# Patient Record
Sex: Female | Born: 1995 | Race: White | Hispanic: No | Marital: Single | Smoking: Never smoker
Health system: Southern US, Community
[De-identification: ages and names within clinical notes are randomized; demographics above are authoritative.]

## PROBLEM LIST (undated history)

## (undated) HISTORY — PX: UMBILICAL HERNIA REPAIR: SHX196

---
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
## 2016-06-17 ENCOUNTER — Encounter: Payer: Self-pay | Admitting: Internal Medicine

## 2016-07-01 ENCOUNTER — Encounter: Payer: Self-pay | Admitting: Internal Medicine

## 2016-07-01 ENCOUNTER — Ambulatory Visit (INDEPENDENT_AMBULATORY_CARE_PROVIDER_SITE_OTHER): Payer: 59 | Admitting: Internal Medicine

## 2016-07-01 VITALS — BP 138/84 | HR 66 | Temp 98.0°F | Resp 18 | Ht 65.0 in | Wt 315.0 lb

## 2016-07-01 DIAGNOSIS — Z130 Encounter for screening for diseases of the blood and blood-forming organs and certain disorders involving the immune mechanism: Secondary | ICD-10-CM

## 2016-07-01 DIAGNOSIS — Z1389 Encounter for screening for other disorder: Secondary | ICD-10-CM

## 2016-07-01 DIAGNOSIS — Z0000 Encounter for general adult medical examination without abnormal findings: Secondary | ICD-10-CM | POA: Diagnosis not present

## 2016-07-01 DIAGNOSIS — Z1329 Encounter for screening for other suspected endocrine disorder: Secondary | ICD-10-CM

## 2016-07-01 DIAGNOSIS — Z79899 Other long term (current) drug therapy: Secondary | ICD-10-CM

## 2016-07-01 DIAGNOSIS — E559 Vitamin D deficiency, unspecified: Secondary | ICD-10-CM

## 2016-07-01 DIAGNOSIS — Z13220 Encounter for screening for lipoid disorders: Secondary | ICD-10-CM

## 2016-07-01 DIAGNOSIS — Z131 Encounter for screening for diabetes mellitus: Secondary | ICD-10-CM

## 2016-07-01 DIAGNOSIS — Z0001 Encounter for general adult medical examination with abnormal findings: Secondary | ICD-10-CM

## 2016-07-01 DIAGNOSIS — R197 Diarrhea, unspecified: Secondary | ICD-10-CM

## 2016-07-01 DIAGNOSIS — Z3009 Encounter for other general counseling and advice on contraception: Secondary | ICD-10-CM

## 2016-07-01 MED ORDER — NORETHINDRONE ACET-ETHINYL EST 1.5-30 MG-MCG PO TABS: 1.0000 | ORAL_TABLET | Freq: Every day | ORAL | 11 refills | Status: DC

## 2016-07-01 MED ORDER — HYOSCYAMINE SULFATE 0.125 MG SL SUBL: SUBLINGUAL_TABLET | SUBLINGUAL | 0 refills | Status: DC

## 2016-07-01 NOTE — Progress Notes (Signed)
Annual Screening Comprehensive Examination   This very nice [age] y.o.female presents for complete physical.  Patient has no major health issues.  Patient reports no complaints at this time.   She is a Consulting civil engineerstudent at [employer].  She is studying Corporate investment bankerChristian Ministry.  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 She reports that she is getting good grades for the most part.  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  She reports that she is having to work on getting people to get along.    She reports that her period has been quite irregular.  She reports that it is really heavy and it can sometimes be very irregular.    She does have periods where for a couple weeks she will eat and then 15-20 minutes later she develops diarrhea.  She reports that it comes and goes.  She reports that this has been going on intermittently for a couple years.  She reports that there is no history of irritable bowel syndrome. She has found that ice cream triggers it.  She reports that she is not sure whether those happen under heavier stress levels.    Finally, patient has history of Vitamin D Deficiency and last vitamin D was No results found for: VD25OH.  Currently on supplementation     No current outpatient prescriptions on file prior to visit.   No current facility-administered medications on file prior to visit.     No Known Allergies  No past medical history on file.   There is no immunization history on file for this patient.  No past surgical history on file.  No family history on file.  Social History   Social History  . Marital status: Single    Spouse name: N/A  . Number of children: N/A  . Years of education: N/A   Occupational History  . Not on file.   Social History Main Topics  . Smoking status: Never Smoker  . Smokeless tobacco: Never Used  . Alcohol use No  . Drug use: No  . Sexual activity: Not on file   Other Topics Concern  . Not on file   Social History Narrative  . No  narrative on file   Review of Systems  Constitutional: Negative for chills, fever and malaise/fatigue.  HENT: Negative for congestion, ear pain, hearing loss and sore throat.   Eyes: Negative.   Respiratory: Negative for cough, shortness of breath, wheezing and stridor.   Cardiovascular: Negative for chest pain, palpitations and leg swelling.  Gastrointestinal: Positive for diarrhea. Negative for abdominal pain, blood in stool, constipation, heartburn, melena, nausea and vomiting.  Genitourinary: Negative.   Neurological: Negative for sensory change and loss of consciousness.  Psychiatric/Behavioral: Negative for depression. The patient is not nervous/anxious and does not have insomnia.       Physical Exam  BP 138/84   Pulse 66   Temp 98 F (36.7 C) (Temporal)   Resp 18   Ht 5\' 5"  (1.651 m)   Wt (!) 315 lb (142.9 kg)   LMP 07/01/2016   SpO2 98%   BMI 52.42 kg/m   General Appearance: Morbidly obese WF, Well nourished and in no apparent distress. Eyes: PERRLA, EOMs, conjunctiva no swelling or erythema, normal fundi and vessels. Sinuses: No frontal/maxillary tenderness ENT/Mouth: EACs patent / TMs  nl. Nares clear without erythema, swelling, mucoid exudates. Oral hygiene is good. No erythema, swelling, or exudate. Tongue normal, non-obstructing. Tonsils not swollen or erythematous. Hearing normal.  Neck:  Supple, thyroid normal. No bruits, nodes or JVD. Respiratory: Respiratory effort normal.  BS equal and clear bilateral without rales, rhonci, wheezing or stridor. Cardio: Heart sounds are normal with regular rate and rhythm and no murmurs, rubs or gallops. Peripheral pulses are normal and equal bilaterally without edema. No aortic or femoral bruits. Chest: symmetric with normal excursions and percussion. Abdomen: Flat, soft, with bowl sounds. Nontender, no guarding, rebound, hernias, masses, or organomegaly.  Lymphatics: Non tender without lymphadenopathy.  Musculoskeletal: Full  ROM all peripheral extremities, joint stability, 5/5 strength, and normal gait. Skin: Warm and dry without rashes, lesions, cyanosis, clubbing or  ecchymosis.  Neuro: Cranial nerves intact, reflexes equal bilaterally. Normal muscle tone, no cerebellar symptoms. Sensation intact.  Pysch: Awake and oriented X 3, normal affect, Insight and Judgment appropriate.   Assessment and Plan   1. Encounter for general adult medical examination with abnormal findings -due next year  2. Screening for diabetes mellitus  - Hemoglobin A1c - Insulin, random  3. Screening for hyperlipidemia  - Lipid panel  4. Screening for deficiency anemia  - Iron and TIBC - Vitamin B12  5. Screening for hematuria or proteinuria  - Urinalysis, Routine w reflex microscopic - Microalbumin / creatinine urine ratio  6. Vitamin D deficiency -likely needs to start Vitamin D - VITAMIN D 25 Hydroxy (Vit-D Deficiency, Fractures)  7. Screening for thyroid disorder  - TSH  8. Diarrhea, unspecified type -Likely IBS -try hyoscamine - Celiac panel 10 - Food Allergy Panel  9. Medication management  - CBC with Differential/Platelet - BASIC METABOLIC PANEL WITH GFR - Hepatic function panel - Magnesium  10. Morbid obesity (HCC) -discussed diet and exercise -pateint admits she is an emotional eater  11.  New start birth control -loloestrin -patient to call if she needs anything      Continue prudent diet as discussed, weight control, regular exercise, and medications. Routine screening labs and tests as requested with regular follow-up as recommended.  Over 40 minutes of exam, counseling, chart review and critical decision making was performed

## 2016-07-01 NOTE — Patient Instructions (Signed)
Ethinyl Estradiol; Norethindrone Acetate tablets (contraception) What is this medicine? ETHINYL ESTRADIOL; NORETHINDRONE ACETATE (ETH in il es tra DYE ole; nor eth IN drone AS e tate) is an oral contraceptive. The products combine two types of female hormones, an estrogen and a progestin. They are used to prevent ovulation and pregnancy. This medicine may be used for other purposes; ask your health care provider or pharmacist if you have questions. COMMON BRAND NAME(S): Gildess, Junel 1.5/30, Junel 1/20, LARIN, Loestrin 1.5/30, Loestrin 1/20, Microgestin 1.5/30, Microgestin 1/20 What should I tell my health care provider before I take this medicine? They need to know if you have or ever had any of these conditions: -abnormal vaginal bleeding -blood vessel disease or blood clots -breast, cervical, endometrial, ovarian, liver, or uterine cancer -diabetes -gallbladder disease -heart disease or recent heart attack -high blood pressure -high cholesterol -kidney disease -liver disease -migraine headaches -stroke -systemic lupus erythematosus (SLE) -tobacco smoker -an unusual or allergic reaction to estrogens, progestins, other medicines, foods, dyes, or preservatives -pregnant or trying to get pregnant -breast-feeding How should I use this medicine? Take this medicine by mouth. To reduce nausea, this medicine may be taken with food. Follow the directions on the prescription label. Take this medicine at the same time each day and in the order directed on the package. Do not take your medicine more often than directed. Contact your pediatrician regarding the use of this medicine in children. Special care may be needed. This medicine has been used in female children who have started having menstrual periods. A patient package insert for the product will be given with each prescription and refill. Read this sheet carefully each time. The sheet may change frequently. Overdosage: If you think you  have taken too much of this medicine contact a poison control center or emergency room at once. NOTE: This medicine is only for you. Do not share this medicine with others. What if I miss a dose? If you miss a dose, refer to the patient information sheet you received with your medicine for direction. If you miss more than one pill, this medicine may not be as effective and you may need to use another form of birth control. What may interact with this medicine? Do not take this medicine with the following medication: -dasabuvir; ombitasvir; paritaprevir; ritonavir -ombitasvir; paritaprevir; ritonavir This medicine may also interact with the following medications: -acetaminophen -antibiotics or medicines for infections, especially rifampin, rifabutin, rifapentine, and griseofulvin, and possibly penicillins or tetracyclines -aprepitant -ascorbic acid (vitamin C) -atorvastatin -barbiturate medicines, such as phenobarbital -bosentan -carbamazepine -caffeine -clofibrate -cyclosporine -dantrolene -doxercalciferol -felbamate -grapefruit juice -hydrocortisone -medicines for anxiety or sleeping problems, such as diazepam or temazepam -medicines for diabetes, including pioglitazone -mineral oil -modafinil -mycophenolate -nefazodone -oxcarbazepine -phenytoin -prednisolone -ritonavir or other medicines for HIV infection or AIDS -rosuvastatin -selegiline -soy isoflavones supplements -St. John's wort -tamoxifen or raloxifene -theophylline -thyroid hormones -topiramate -warfarin This list may not describe all possible interactions. Give your health care provider a list of all the medicines, herbs, non-prescription drugs, or dietary supplements you use. Also tell them if you smoke, drink alcohol, or use illegal drugs. Some items may interact with your medicine. What should I watch for while using this medicine? Visit your doctor or health care professional for regular checks on your  progress. You will need a regular breast and pelvic exam and Pap smear while on this medicine. Use an additional method of contraception during the first cycle that you take these tablets. If you have any   reason to think you are pregnant, stop taking this medicine right away and contact your doctor or health care professional. If you are taking this medicine for hormone related problems, it may take several cycles of use to see improvement in your condition. Smoking increases the risk of getting a blood clot or having a stroke while you are taking birth control pills, especially if you are more than 22 years old. You are strongly advised not to smoke. This medicine can make your body retain fluid, making your fingers, hands, or ankles swell. Your blood pressure can go up. Contact your doctor or health care professional if you feel you are retaining fluid. This medicine can make you more sensitive to the sun. Keep out of the sun. If you cannot avoid being in the sun, wear protective clothing and use sunscreen. Do not use sun lamps or tanning beds/booths. If you wear contact lenses and notice visual changes, or if the lenses begin to feel uncomfortable, consult your eye care specialist. In some women, tenderness, swelling, or minor bleeding of the gums may occur. Notify your dentist if this happens. Brushing and flossing your teeth regularly may help limit this. See your dentist regularly and inform your dentist of the medicines you are taking. If you are going to have elective surgery, you may need to stop taking this medicine before the surgery. Consult your health care professional for advice. This medicine does not protect you against HIV infection (AIDS) or any other sexually transmitted diseases. What side effects may I notice from receiving this medicine? Side effects that you should report to your doctor or health care professional as soon as possible: -breast tissue changes or discharge -changes  in vaginal bleeding during your period or between your periods -chest pain -coughing up blood -dizziness or fainting spells -headaches or migraines -leg, arm or groin pain -severe or sudden headaches -stomach pain (severe) -sudden shortness of breath -sudden loss of coordination, especially on one side of the body -speech problems -symptoms of vaginal infection like itching, irritation or unusual discharge -tenderness in the upper abdomen -vomiting -weakness or numbness in the arms or legs, especially on one side of the body -yellowing of the eyes or skin Side effects that usually do not require medical attention (report to your doctor or health care professional if they continue or are bothersome): -breakthrough bleeding and spotting that continues beyond the 3 initial cycles of pills -breast tenderness -mood changes, anxiety, depression, frustration, anger, or emotional outbursts -increased sensitivity to sun or ultraviolet light -nausea -skin rash, acne, or brown spots on the skin -weight gain (slight) This list may not describe all possible side effects. Call your doctor for medical advice about side effects. You may report side effects to FDA at 1-800-FDA-1088. Where should I keep my medicine? Keep out of the reach of children. Store at room temperature between 15 and 30 degrees C (59 and 86 degrees F). Throw away any unused medicine after the expiration date. NOTE: This sheet is a summary. It may not cover all possible information. If you have questions about this medicine, talk to your doctor, pharmacist, or health care provider.  2018 Elsevier/Gold Standard (2015-12-25 08:02:50)  

## 2016-07-02 ENCOUNTER — Encounter: Payer: Self-pay | Admitting: Internal Medicine

## 2016-07-02 LAB — IRON AND TIBC
%SAT: 16 % (ref 11–50)
IRON: 59 ug/dL (ref 40–190)
TIBC: 369 ug/dL (ref 250–450)
UIBC: 310 ug/dL (ref 125–400)

## 2016-07-02 LAB — CBC WITH DIFFERENTIAL/PLATELET
BASOS PCT: 1 %
Basophils Absolute: 81 cells/uL (ref 0–200)
Eosinophils Absolute: 81 cells/uL (ref 15–500)
Eosinophils Relative: 1 %
HEMATOCRIT: 42.6 % (ref 35.0–45.0)
Hemoglobin: 13.7 g/dL (ref 11.7–15.5)
LYMPHS PCT: 30 %
Lymphs Abs: 2430 cells/uL (ref 850–3900)
MCH: 29.4 pg (ref 27.0–33.0)
MCHC: 32.2 g/dL (ref 32.0–36.0)
MCV: 91.4 fL (ref 80.0–100.0)
MONOS PCT: 4 %
MPV: 9.7 fL (ref 7.5–12.5)
Monocytes Absolute: 324 cells/uL (ref 200–950)
NEUTROS PCT: 64 %
Neutro Abs: 5184 cells/uL (ref 1500–7800)
PLATELETS: 408 10*3/uL — AB (ref 140–400)
RBC: 4.66 MIL/uL (ref 3.80–5.10)
RDW: 13.5 % (ref 11.0–15.0)
WBC: 8.1 10*3/uL (ref 3.8–10.8)

## 2016-07-02 LAB — HEMOGLOBIN A1C
Hgb A1c MFr Bld: 5.4 % (ref <5.7)
Mean Plasma Glucose: 108 mg/dL

## 2016-07-02 LAB — HEPATIC FUNCTION PANEL
ALBUMIN: 4.4 g/dL (ref 3.6–5.1)
ALK PHOS: 62 U/L (ref 33–115)
ALT: 17 U/L (ref 6–29)
AST: 16 U/L (ref 10–30)
BILIRUBIN TOTAL: 0.3 mg/dL (ref 0.2–1.2)
Bilirubin, Direct: 0 mg/dL (ref <=0.2)
Indirect Bilirubin: 0.3 mg/dL (ref 0.2–1.2)
TOTAL PROTEIN: 7.5 g/dL (ref 6.1–8.1)

## 2016-07-02 LAB — IGA: IgA: 116 mg/dL (ref 81–463)

## 2016-07-02 LAB — URINALYSIS, MICROSCOPIC ONLY
BACTERIA UA: NONE SEEN [HPF]
Casts: NONE SEEN [LPF]
Crystals: NONE SEEN [HPF]
Yeast: NONE SEEN [HPF]

## 2016-07-02 LAB — LIPID PANEL
CHOLESTEROL: 243 mg/dL — AB (ref <200)
HDL: 43 mg/dL — AB (ref >50)
LDL Cholesterol: 166 mg/dL — ABNORMAL HIGH (ref <100)
TRIGLYCERIDES: 168 mg/dL — AB (ref <150)
Total CHOL/HDL Ratio: 5.7 Ratio — ABNORMAL HIGH (ref <5.0)
VLDL: 34 mg/dL — ABNORMAL HIGH (ref <30)

## 2016-07-02 LAB — URINALYSIS, ROUTINE W REFLEX MICROSCOPIC
BILIRUBIN URINE: NEGATIVE
GLUCOSE, UA: NEGATIVE
KETONES UR: NEGATIVE
Nitrite: NEGATIVE
PH: 6 (ref 5.0–8.0)
Specific Gravity, Urine: 1.013 (ref 1.001–1.035)

## 2016-07-02 LAB — MAGNESIUM: Magnesium: 2.1 mg/dL (ref 1.5–2.5)

## 2016-07-02 LAB — BASIC METABOLIC PANEL WITH GFR
BUN: 10 mg/dL (ref 7–25)
CALCIUM: 9.7 mg/dL (ref 8.6–10.2)
CO2: 27 mmol/L (ref 20–31)
Chloride: 103 mmol/L (ref 98–110)
Creat: 0.63 mg/dL (ref 0.50–1.10)
GLUCOSE: 80 mg/dL (ref 65–99)
POTASSIUM: 4.4 mmol/L (ref 3.5–5.3)
Sodium: 138 mmol/L (ref 135–146)

## 2016-07-02 LAB — VITAMIN B12: VITAMIN B 12: 350 pg/mL (ref 200–1100)

## 2016-07-02 LAB — FOOD ALLERGY PANEL
Clams: <0.1 kU/L
Corn: <0.1 kU/L
Fish Cod: <0.1 kU/L
Peanut IgE: <0.1 kU/L
Soybean IgE: <0.1 kU/L

## 2016-07-02 LAB — TSH: TSH: 3.12 mIU/L

## 2016-07-02 LAB — MICROALBUMIN / CREATININE URINE RATIO
Creatinine, Urine: 74 mg/dL (ref 20–320)
MICROALB/CREAT RATIO: 59 ug/mg{creat} — AB (ref <30)
Microalb, Ur: 4.4 mg/dL

## 2016-07-02 LAB — VITAMIN D 25 HYDROXY (VIT D DEFICIENCY, FRACTURES): Vit D, 25-Hydroxy: 19 ng/mL — ABNORMAL LOW (ref 30–100)

## 2016-07-02 LAB — INSULIN, RANDOM: INSULIN: 12.2 u[IU]/mL (ref 2.0–19.6)

## 2016-07-02 LAB — ENDOMYSIAL AB IGA RFLX TITER: ENDOMYSIAL SCREEN: NEGATIVE

## 2016-07-04 LAB — GLIADIN ANTIBODIES, SERUM
GLIADIN IGA: 6 U (ref <20)
Gliadin IgG: 2 Units (ref <20)

## 2016-07-04 LAB — TISSUE TRANSGLUTAMINASE, IGG: TISSUE TRANSGLUT AB: 2 U/mL (ref <6)

## 2016-07-04 LAB — TISSUE TRANSGLUTAMINASE, IGA: TISSUE TRANSGLUTAMINASE AB, IGA: 1 U/mL (ref <4)

## 2016-07-07 ENCOUNTER — Encounter: Payer: Self-pay | Admitting: Internal Medicine

## 2016-07-22 ENCOUNTER — Encounter: Payer: Self-pay | Admitting: Internal Medicine

## 2016-08-19 ENCOUNTER — Encounter: Payer: Self-pay | Admitting: Physician Assistant

## 2016-08-19 ENCOUNTER — Ambulatory Visit (INDEPENDENT_AMBULATORY_CARE_PROVIDER_SITE_OTHER): Payer: 59 | Admitting: Physician Assistant

## 2016-08-19 ENCOUNTER — Ambulatory Visit: Payer: 59

## 2016-08-19 VITALS — BP 118/74 | HR 76 | Temp 97.3°F | Resp 16 | Ht 65.0 in | Wt 306.0 lb

## 2016-08-19 DIAGNOSIS — R0789 Other chest pain: Secondary | ICD-10-CM | POA: Diagnosis not present

## 2016-08-19 DIAGNOSIS — G44209 Tension-type headache, unspecified, not intractable: Secondary | ICD-10-CM | POA: Diagnosis not present

## 2016-08-19 DIAGNOSIS — Z23 Encounter for immunization: Secondary | ICD-10-CM

## 2016-08-19 DIAGNOSIS — R42 Dizziness and giddiness: Secondary | ICD-10-CM | POA: Diagnosis not present

## 2016-08-19 LAB — HEPATIC FUNCTION PANEL
ALK PHOS: 40 U/L (ref 33–115)
ALT: 10 U/L (ref 6–29)
AST: 16 U/L (ref 10–30)
Albumin: 4 g/dL (ref 3.6–5.1)
Bilirubin, Direct: 0 mg/dL (ref <=0.2)
Indirect Bilirubin: 0.2 mg/dL (ref 0.2–1.2)
TOTAL PROTEIN: 6.8 g/dL (ref 6.1–8.1)
Total Bilirubin: 0.2 mg/dL (ref 0.2–1.2)

## 2016-08-19 LAB — BASIC METABOLIC PANEL WITH GFR
BUN: 9 mg/dL (ref 7–25)
CHLORIDE: 106 mmol/L (ref 98–110)
CO2: 20 mmol/L (ref 20–31)
CREATININE: 0.67 mg/dL (ref 0.50–1.10)
Calcium: 9.4 mg/dL (ref 8.6–10.2)
GFR, Est African American: >89 mL/min (ref >=60)
GFR, Est Non African American: >89 mL/min (ref >=60)
GLUCOSE: 82 mg/dL (ref 65–99)
POTASSIUM: 4.6 mmol/L (ref 3.5–5.3)
Sodium: 139 mmol/L (ref 135–146)

## 2016-08-19 LAB — CBC WITH DIFFERENTIAL/PLATELET
BASOS ABS: 61 {cells}/uL (ref 0–200)
BASOS PCT: 1 %
EOS ABS: 61 {cells}/uL (ref 15–500)
Eosinophils Relative: 1 %
HEMATOCRIT: 39.9 % (ref 35.0–45.0)
Hemoglobin: 13 g/dL (ref 11.7–15.5)
LYMPHS PCT: 39 %
Lymphs Abs: 2379 cells/uL (ref 850–3900)
MCH: 29.1 pg (ref 27.0–33.0)
MCHC: 32.6 g/dL (ref 32.0–36.0)
MCV: 89.5 fL (ref 80.0–100.0)
MONO ABS: 244 {cells}/uL (ref 200–950)
MPV: 9.8 fL (ref 7.5–12.5)
Monocytes Relative: 4 %
Neutro Abs: 3355 cells/uL (ref 1500–7800)
Neutrophils Relative %: 55 %
Platelets: 361 10*3/uL (ref 140–400)
RBC: 4.46 MIL/uL (ref 3.80–5.10)
RDW: 13.5 % (ref 11.0–15.0)
WBC: 6.1 10*3/uL (ref 3.8–10.8)

## 2016-08-19 LAB — TSH: TSH: 2.43 mIU/L

## 2016-08-19 MED ORDER — PREDNISONE 20 MG PO TABS: ORAL_TABLET | ORAL | 0 refills | Status: DC

## 2016-08-19 NOTE — Patient Instructions (Addendum)
What is the TMJ? The temporomandibular (tem-PUH-ro-man-DIB-yoo-ler) joint, or the TMJ, connects the upper and lower jawbones. This joint allows the jaw to open wide and move back and forth when you chew, talk, or yawn.There are also several muscles that help this joint move. There can be muscle tightness and pain in the muscle that can cause several symptoms.  What causes TMJ pain? There are many causes of TMJ pain. Repeated chewing (for example, chewing gum) and clenching your teeth can cause pain in the joint. Some TMJ pain has no obvious cause. What can I do to ease the pain? There are many things you can do to help your pain get better. When you have pain:  Eat soft foods and stay away from chewy foods (for example, taffy) Try to use both sides of your mouth to chew Don't chew gum Massage Don't open your mouth wide (for example, during yawning or singing) Don't bite your cheeks or fingernails Lower your amount of stress and worry Applying a warm, damp washcloth to the joint may help. Over-the-counter pain medicines such as ibuprofen (one brand: Advil) or acetaminophen (one brand: Tylenol) might also help. Do not use these medicines if you are allergic to them or if your doctor told you not to use them. How can I stop the pain from coming back? When your pain is better, you can do these exercises to make your muscles stronger and to keep the pain from coming back:  Resisted mouth opening: Place your thumb or two fingers under your chin and open your mouth slowly, pushing up lightly on your chin with your thumb. Hold for three to six seconds. Close your mouth slowly. Resisted mouth closing: Place your thumbs under your chin and your two index fingers on the ridge between your mouth and the bottom of your chin. Push down lightly on your chin as you close your mouth. Tongue up: Slowly open and close your mouth while keeping the tongue touching the roof of the mouth. Side-to-side jaw movement:  Place an object about one fourth of an inch thick (for example, two tongue depressors) between your front teeth. Slowly move your jaw from side to side. Increase the thickness of the object as the exercise becomes easier Forward jaw movement: Place an object about one fourth of an inch thick between your front teeth and move the bottom jaw forward so that the bottom teeth are in front of the top teeth. Increase the thickness of the object as the exercise becomes easier. These exercises should not be painful. If it hurts to do these exercises, stop doing them and talk to your family doctor.    Dizziness Dizziness is a common problem. It is a feeling of unsteadiness or light-headedness. You may feel like you are about to faint. Dizziness can lead to injury if you stumble or fall. Anyone can become dizzy, but dizziness is more common in older adults. This condition can be caused by a number of things, including medicines, dehydration, or illness. Follow these instructions at home: Taking these steps may help with your condition: Eating and drinking   Drink enough fluid to keep your urine clear or pale yellow. This helps to keep you from becoming dehydrated. Try to drink more clear fluids, such as water.  Do not drink alcohol.  Limit your caffeine intake if directed by your health care provider.  Limit your salt intake if directed by your health care provider. Activity   Avoid making quick movements.  Rise  slowly from chairs and steady yourself until you feel okay.  In the morning, first sit up on the side of the bed. When you feel okay, stand slowly while you hold onto something until you know that your balance is fine.  Move your legs often if you need to stand in one place for a long time. Tighten and relax your muscles in your legs while you are standing.  Do not drive or operate heavy machinery if you feel dizzy.  Avoid bending down if you feel dizzy. Place items in your home so that  they are easy for you to reach without leaning over. Lifestyle   Do not use any tobacco products, including cigarettes, chewing tobacco, or electronic cigarettes. If you need help quitting, ask your health care provider.  Try to reduce your stress level, such as with yoga or meditation. Talk with your health care provider if you need help. General instructions   Watch your dizziness for any changes.  Take medicines only as directed by your health care provider. Talk with your health care provider if you think that your dizziness is caused by a medicine that you are taking.  Tell a friend or a family member that you are feeling dizzy. If he or she notices any changes in your behavior, have this person call your health care provider.  Keep all follow-up visits as directed by your health care provider. This is important. Contact a health care provider if:  Your dizziness does not go away.  Your dizziness or light-headedness gets worse.  You feel nauseous.  You have reduced hearing.  You have new symptoms.  You are unsteady on your feet or you feel like the room is spinning. Get help right away if:  You vomit or have diarrhea and are unable to eat or drink anything.  You have problems talking, walking, swallowing, or using your arms, hands, or legs.  You feel generally weak.  You are not thinking clearly or you have trouble forming sentences. It may take a friend or family member to notice this.  You have chest pain, abdominal pain, shortness of breath, or sweating.  Your vision changes.  You notice any bleeding.  You have a headache.  You have neck pain or a stiff neck.  You have a fever. This information is not intended to replace advice given to you by your health care provider. Make sure you discuss any questions you have with your health care provider. Document Released: 10/09/2000 Document Revised: 09/21/2015 Document Reviewed: 04/11/2014 Elsevier Interactive  Patient Education  2017 [employer].

## 2016-08-19 NOTE — Progress Notes (Signed)
Subjective:    Patient ID: Yvette Phelps, female    DOB: 1995/07/17, 21 y.o.   MRN: [id number]  HPI 21 y.o. obese WF presents with dizziness, HA behind left eye, chest tightness several occasions. Works at hotel, was working wedding reception, had mild HA with chest tightness, with some nausea and vertigo worse with lying down, movement, no SOB, palpitations, CP. Slight decrease in appetite.    Has history of LOS x 2 one 4 years ago, has been on BCP x 2 months.  BS 86 this AM per mom.   BMI is Body mass index is 50.92 kg/m., she is working on diet and exercise. Wt Readings from Last 3 Encounters:  08/19/16 (!) 306 lb (138.8 kg)  07/01/16 (!) 315 lb (142.9 kg)  11/15/12 250 lb (113.4 kg) (>99 %, Z= 2.45)*   * Growth percentiles are based on CDC 2-20 Years data.   Medications Current Outpatient Prescriptions on File Prior to Visit  Medication Sig  . hyoscyamine (LEVSIN SL) 0.125 MG SL tablet Take 1 to 2 tablets 3 to 4 x day if needed for Nausea, vomiting, cramping or diarrhea  . Norethindrone Acetate-Ethinyl Estradiol (LOESTRIN 1.5/30, 21,) 1.5-30 MG-MCG tablet Take 1 tablet by mouth daily.   No current facility-administered medications on file prior to visit.     Problem list She  does not have a problem list on file.    Review of Systems  Constitutional: Positive for appetite change (decrease). Negative for chills and fever.  HENT: Negative for hearing loss.   Respiratory: Positive for chest tightness. Negative for shortness of breath and wheezing.   Cardiovascular: Negative.  Negative for chest pain, palpitations and leg swelling.  Gastrointestinal: Negative.   Genitourinary: Negative.   Musculoskeletal: Negative.   Neurological: Positive for dizziness and headaches. Negative for tremors, seizures, syncope, facial asymmetry, speech difficulty, weakness, light-headedness and numbness.  Psychiatric/Behavioral: Negative.        Objective:   Physical Exam   Constitutional: She is oriented to person, place, and time. She appears well-developed and well-nourished.  HENT:  Head: Normocephalic and atraumatic.  Right Ear: Hearing and external ear normal. No mastoid tenderness. Tympanic membrane is not injected, not erythematous, not retracted and not bulging. A middle ear effusion is present.  Left Ear: Hearing and external ear normal. No mastoid tenderness. Tympanic membrane is not injected, not erythematous, not retracted and not bulging. A middle ear effusion is present.  + TMJ left worse than right, no abscess appreciated  Eyes: Conjunctivae are normal. Pupils are equal, round, and reactive to light.  Neck: Normal range of motion. Neck supple.  Cardiovascular: Normal rate.   Pulmonary/Chest: Effort normal and breath sounds normal.  Musculoskeletal: Normal range of motion.  Neurological: She is alert and oriented to person, place, and time.  Skin: Skin is warm and dry.       Assessment & Plan:   Chest tightness, HA, vertigo Normal neuro, no red flags ? TMJ, discussed TMJ - D-dimer, quantitative (not at [facility]) - EKG 12-Lead - prednisone Check labs - CBC with Differential/Platelet - BASIC METABOLIC PANEL WITH GFR - Hepatic function panel - TSH  Need for prophylactic vaccination against diphtheria and tetanus TDAP need  .The patient was advised to call immediately if she has any concerning symptoms in the interval. The patient voices understanding of current treatment options and is in agreement with the current care plan.The patient knows to call the clinic with any problems, questions or concerns or  go to the ER if any further progression of symptoms.

## 2016-08-20 LAB — D-DIMER, QUANTITATIVE (NOT AT ARMC): D DIMER QUANT: 0.21 ug{FEU}/mL (ref <0.50)

## 2016-09-11 ENCOUNTER — Encounter: Payer: Self-pay | Admitting: *Deleted

## 2016-12-16 ENCOUNTER — Encounter: Payer: Self-pay | Admitting: Physician Assistant

## 2016-12-16 ENCOUNTER — Ambulatory Visit (INDEPENDENT_AMBULATORY_CARE_PROVIDER_SITE_OTHER): Payer: 59 | Admitting: Physician Assistant

## 2016-12-16 VITALS — BP 120/76 | HR 84 | Temp 97.3°F | Resp 16 | Ht 65.0 in | Wt 289.4 lb

## 2016-12-16 DIAGNOSIS — R21 Rash and other nonspecific skin eruption: Secondary | ICD-10-CM

## 2016-12-16 MED ORDER — PREDNISONE 20 MG PO TABS: ORAL_TABLET | ORAL | 0 refills | Status: DC

## 2016-12-16 MED ORDER — CLOTRIMAZOLE-BETAMETHASONE 1-0.05 % EX CREA: 1.0000 "application " | TOPICAL_CREAM | Freq: Two times a day (BID) | CUTANEOUS | 2 refills | Status: DC

## 2016-12-16 NOTE — Progress Notes (Signed)
   Subjective:    Patient ID: Yvette Phelps, female    DOB: [birth year], 21 y.o.   MRN: [id number]  HPI 21 y.o. morbidly obese white female with history of chol presents with rash.  Has rash under right breast x Thursday, very itchy, now on left breast. She has put benadryl cream and cortisone that did not help much.  Denies specific medication, food, skin care product, detergent, soap, or other environmental triggers have been identified. Had sinus infection 3 weeks ago, took amoxicillin from UC.  She did not experience concomitant cardiopulmonary or GI symptoms.  She has no specific nasal symptom complaints today.  Blood pressure 120/76, pulse 84, temperature (!) 97.3 F (36.3 C), resp. rate 16, height 5\' 5"  (1.651 m), weight 289 lb 6.4 oz (131.3 kg), SpO2 99 %.  Medications Current Outpatient Prescriptions on File Prior to Visit  Medication Sig  . hyoscyamine (LEVSIN SL) 0.125 MG SL tablet Take 1 to 2 tablets 3 to 4 x day if needed for Nausea, vomiting, cramping or diarrhea  . Norethindrone Acetate-Ethinyl Estradiol (LOESTRIN 1.5/30, 21,) 1.5-30 MG-MCG tablet Take 1 tablet by mouth daily.   No current facility-administered medications on file prior to visit.     Problem list She  does not have a problem list on file.   Review of Systems  Constitutional: Negative.  Negative for chills and fever.  HENT: Negative.   Respiratory: Negative.   Cardiovascular: Negative.   Gastrointestinal: Negative.  Negative for diarrhea.  Genitourinary: Negative.   Musculoskeletal: Negative.  Negative for arthralgias.  Skin: Positive for rash. Negative for color change and wound.  Neurological: Negative.  Negative for dizziness.       Objective:   Physical Exam  Pulmonary/Chest: She has no wheezes.  Skin: Rash noted.  Right breast with erythematous linear papules under breast and some on AB, left breast with erythematous clustered papules superior to nipple and along chest, no  discharge, no warmth.        Assessment & Plan:   Rash ? Contact dermatitis like poison ivy versus years Will do cream, if not better take prednisone Weight loss advised Normal breast exam -     clotrimazole-betamethasone (LOTRISONE) cream; Apply 1 application topically 2 (two) times daily. -     predniSONE (DELTASONE) 20 MG tablet; 2 tablets daily for 3 days, 1 tablet daily for 4 days.   Future Appointments Date Time Provider Department Center  07/28/2017 10:00 AM Quentin Mulling, PA-C [facility] None

## 2016-12-16 NOTE — Patient Instructions (Signed)
Use the cream twice a day Keep dry If not better in 2-4 days or starting to spread more take the prednisone  Please take the prednisone to help decrease inflammation and therefore decrease symptoms. Take it it with food to avoid GI upset. It can cause increased energy but on the other hand it can make it hard to sleep at night so please take it AT NIGHT WITH DINNER, it takes 8-12 hours to start working so it will NOT affect your sleeping if you take it at night with your food!!  If you are diabetic it will increase your sugars so decrease carbs and monitor your sugars closely.     Rash A rash is a change in the color of the skin. A rash can also change the way your skin feels. There are many different conditions and factors that can cause a rash. Follow these instructions at home: Pay attention to any changes in your symptoms. Follow these instructions to help with your condition: Medicine Take or apply over-the-counter and prescription medicines only as told by your health care provider. These may include:  Corticosteroid cream.  Anti-itch lotions.  Oral antihistamines.  Skin Care  Apply cool compresses to the affected areas.  Try taking a bath with: ? Epsom salts. Follow the instructions on the packaging. You can get these at your local pharmacy or grocery store. ? Baking soda. Pour a small amount into the bath as told by your health care provider. ? Colloidal oatmeal. Follow the instructions on the packaging. You can get this at your local pharmacy or grocery store.  Try applying baking soda paste to your skin. Stir water into baking soda until it reaches a paste-like consistency.  Do not scratch or rub your skin.  Avoid covering the rash. Make sure the rash is exposed to air as much as possible. General instructions  Avoid hot showers or baths, which can make itching worse. A cold shower may help.  Avoid scented soaps, detergents, and perfumes. Use gentle soaps, detergents,  perfumes, and other cosmetic products.  Avoid any substance that causes your rash. Keep a journal to help track what causes your rash. Write down: ? What you eat. ? What cosmetic products you use. ? What you drink. ? What you wear. This includes jewelry.  Keep all follow-up visits as told by your health care provider. This is important. Contact a health care provider if:  You sweat at night.  You lose weight.  You urinate more than normal.  You feel weak.  You vomit.  Your skin or the whites of your eyes look yellow (jaundice).  Your skin: ? Tingles. ? Is numb.  Your rash: ? Does not go away after several days. ? Gets worse.  You are: ? Unusually thirsty. ? More tired than normal.  You have: ? New symptoms. ? Pain in your abdomen. ? A fever. ? Diarrhea. Get help right away if:  You develop a rash that covers all or most of your body. The rash may or may not be painful.  You develop blisters that: ? Are on top of the rash. ? Grow larger or grow together. ? Are painful. ? Are inside your nose or mouth.  You develop a rash that: ? Looks like purple pinprick-sized spots all over your body. ? Has a "bull's eye" or looks like a target. ? Is not related to sun exposure, is red and painful, and causes your skin to peel. This information is not intended  to replace advice given to you by your health care provider. Make sure you discuss any questions you have with your health care provider. Document Released: 04/05/2002 Document Revised: 09/19/2015 Document Reviewed: 08/31/2014 Elsevier Interactive Patient Education  2017 [employer].

## 2017-01-20 ENCOUNTER — Encounter: Payer: Self-pay | Admitting: Adult Health

## 2017-01-20 ENCOUNTER — Ambulatory Visit (INDEPENDENT_AMBULATORY_CARE_PROVIDER_SITE_OTHER): Payer: 59 | Admitting: Adult Health

## 2017-01-20 VITALS — BP 118/74 | Temp 97.6°F | Resp 16 | Ht 65.0 in | Wt 298.0 lb

## 2017-01-20 DIAGNOSIS — M79672 Pain in left foot: Secondary | ICD-10-CM | POA: Diagnosis not present

## 2017-01-20 MED ORDER — MELOXICAM 15 MG PO TABS: ORAL_TABLET | ORAL | 1 refills | Status: DC

## 2017-01-20 NOTE — Patient Instructions (Signed)
Heel Spur A heel spur is a bony growth that forms on the bottom of your heel bone (calcaneus). Heel spurs are common and do not always cause pain. However, heel spurs often cause inflammation in the strong band of tissue that runs underneath the bone of your foot (plantar fascia). When this happens, you may feel pain on the bottom of your foot, near your heel. What are the causes? The cause of heel spurs is not completely understood. They may be caused by pressure on the heel. Or, they may stem from the muscle attachments (tendons) near the spur pulling on the heel. What increases the risk? You may be at risk for a heel spur if you:  Are older than 40.  Are overweight.  Have wear and tear arthritis (osteoarthritis).  Have plantar fascia inflammation.  What are the signs or symptoms? Some people have heel spurs but no symptoms. If you do have symptoms, they may include:  Pain in the bottom of your heel.  Pain that is worse when you first get out of bed.  Pain that gets worse after walking or standing.  How is this diagnosed? Your health care provider may diagnose a heel spur based on your symptoms and a physical exam. You may also have an X-ray of your foot to check for a bony growth coming from the calcaneus. How is this treated? Treatment aims to relieve the pain from the heel spur. This may include:  Stretching exercises.  Losing weight.  Wearing specific shoes, inserts, or orthotics for comfort and support.  Wearing splints at night to properly position your feet.  Taking over-the-counter medicine to relieve pain.  Being treated with high-intensity sound waves to break up the heel spur (extracorporeal shock wave therapy).  Getting steroid injections in your heel to reduce swelling and ease pain.  Having surgery if your heel spur causes long-term (chronic) pain.  Follow these instructions at home:  Take medicines only as directed by your health care provider.  Ask  your health care provider if you should use ice or cold packs on the painful areas of your heel or foot.  Avoid activities that cause you pain until you recover or as directed by your health care provider.  Stretch before exercising or being physically active.  Wear supportive shoes that fit well as directed by your health care provider. You might need to buy new shoes. Wearing old shoes or shoes that do not fit correctly may not provide the support that you need.  Lose weight if your health care provider thinks you should. This can relieve pressure on your foot that may be causing pain and discomfort. Contact a health care provider if:  Your pain continues or gets worse. This information is not intended to replace advice given to you by your health care provider. Make sure you discuss any questions you have with your health care provider. Document Released: 05/22/2005 Document Revised: 09/21/2015 Document Reviewed: 06/16/2013 Elsevier Interactive Patient Education  2018 Elsevier Inc.  

## 2017-01-20 NOTE — Progress Notes (Signed)
Assessment and Plan:  Yvette Phelps was seen today for acute visit.  Diagnoses and all orders for this visit:  Pain of left heel -     meloxicam (MOBIC) 15 MG tablet; Take one daily with food for 3-4 weeks, can take with tylenol, can not take with aleve, iburpofen, then as needed daily for pain  Suspect plantar fascitis; will trial mobic daily with stretches for 3-4 weeks, then call or send [place] a message with progress so we may either schedule an injection or provide a referral to podiatry. Information regarding plantar fascitis provided, recommended she look into shoes with improved support/purchase insoles to prevent long term recurrence and pain.   Further disposition pending results of labs. Discussed med's effects and SE's.   Over 15 minutes of exam, counseling, chart review, and critical decision making was performed.   Future Appointments Date Time Provider Department Center  07/28/2017 10:00 AM Quentin Mulling, PA-C [facility] None    ------------------------------------------------------------------------------------------------------------------   HPI 21 y.o.female presents for right heel pain x 5 months, sharp in nature, without radiation. She notes the pain began a few months after an acute ankle injury in March, and has gradually worsened over the summer. She reports the pain is worst in the morning upon awakening, and with extended immobility such as after she has been sitting for a while, and improves with movement.   Has tried ibuprofen 200 mg tabs occasionally, cannot tell if it helped. She has not tried applying heat/cold, pain is worse after rest (worse in am/after sitting extensively), has not tried stretching or massage.   No past medical history on file.   No Known Allergies  Current Outpatient Prescriptions on File Prior to Visit  Medication Sig  . hyoscyamine (LEVSIN SL) 0.125 MG SL tablet Take 1 to 2 tablets 3 to 4 x day if needed for Nausea, vomiting, cramping or  diarrhea  . Norethindrone Acetate-Ethinyl Estradiol (LOESTRIN 1.5/30, 21,) 1.5-30 MG-MCG tablet Take 1 tablet by mouth daily.   No current facility-administered medications on file prior to visit.     ROS: all negative except above.   Physical Exam:  BP 118/74   Temp 97.6 F (36.4 C)   Resp 16   Ht  (1.651 m)   Wt 298 lb (135.2 kg)   LMP 12/23/2016   BMI 49.59 kg/m   General Appearance: Well nourished obese female in no apparent distress. Neck: Supple, thyroid normal.  Respiratory: Respiratory effort normal, BS equal bilaterally without rales, rhonchi, wheezing or stridor.  Cardio: RRR with no MRGs. Brisk peripheral pulses without edema.  Abdomen: Soft, + BS.  Non tender, no guarding. Musculoskeletal: Full ROM, 5/5 strength, normal gait. Right foot/ankle with full ROM, no point tenderness/crepitus to calcaneus or surrounding joints, heel non-injected/without swelling. Pain not reproducible during exam.  Skin: Warm, dry without rashes, lesions, ecchymosis.  Neuro: Cranial nerves intact. Normal muscle tone, no cerebellar symptoms. Sensation intact.  Psych: Awake and oriented X 3, normal affect, Insight and Judgment appropriate.     Dan Maker, NP 4:54 PM [facility]

## 2017-03-03 ENCOUNTER — Encounter: Payer: Self-pay | Admitting: Physician Assistant

## 2017-03-04 NOTE — Progress Notes (Signed)
Assessment and Plan:  Remedios was seen today for oral swelling.  Diagnoses and all orders for this visit:  Mucous patch of oral mucosa -     Does not appear to be tonsillolith; no accompaniments suggestive of infection -     Monitor over next 1-2 weeks for changes, encouraged salt water gargles and thorough oral hygiene, if the area grows and is not responsive to salt water gargles, she my try nystatin rinse -     nystatin (MYCOSTATIN) 100000 UNIT/ML suspension; 5 ml four times a day, retain in mouth as long as possible (Swish and Spit).  Use for 48 hours after symptoms resolve. -     She is to call the office with any new symptoms, if she develops a sore throat or fever  Further disposition pending results of labs. Discussed med's effects and SE's.   Over 15 minutes of exam, counseling, chart review, and critical decision making was performed.   Future Appointments  Date Time Provider Department Center  07/28/2017 10:00 AM Quentin Mullingollier, Amanda, PA-C [facility] None    ------------------------------------------------------------------------------------------------------------------   HPI BP 110/74   Pulse 64   Temp (!) 97.5 F (36.4 C)   Ht 5\' 5"  (1.651 m)   Wt (!) 304 lb 12.8 oz (138.3 kg)   LMP 02/19/2017 (Approximate)   SpO2 99%   BMI 50.72 kg/m   21 y.o.female presents after she noted a small white area on her tonsil; she had an acquaintance look at it who told her it was a "tonsil stone." She denies sore throat, cough, hoarseness, halitosis, fever, drooling, fever, fatigue, rash. She has never had anything similar before. She has not tried any interventions.    No past medical history on file.   No Known Allergies  Current Outpatient Medications on File Prior to Visit  Medication Sig  . meloxicam (MOBIC) 15 MG tablet Take one daily with food for 3-4 weeks, can take with tylenol, can not take with aleve, iburpofen, then as needed daily for pain  . Norethindrone  Acetate-Ethinyl Estradiol (LOESTRIN 1.5/30, 21,) 1.5-30 MG-MCG tablet Take 1 tablet by mouth daily.  . hyoscyamine (LEVSIN SL) 0.125 MG SL tablet Take 1 to 2 tablets 3 to 4 x day if needed for Nausea, vomiting, cramping or diarrhea (Patient not taking: Reported on 03/05/2017)   No current facility-administered medications on file prior to visit.     ROS: all negative except above.   Physical Exam:  BP 110/74   Pulse 64   Temp (!) 97.5 F (36.4 C)   Ht 5\' 5"  (1.651 m)   Wt (!) 304 lb 12.8 oz (138.3 kg)   LMP 02/19/2017 (Approximate)   SpO2 99%   BMI 50.72 kg/m   General Appearance: Well nourished, in no apparent distress. Eyes: PERRLA, conjunctiva no swelling or erythema Sinuses: No Frontal/maxillary tenderness ENT/Mouth: No erythema, swelling, or exudate on post pharynx.  Tonsils not swollen or erythematous. Uvula midline, no abcesses. Single 2mm white patch to L tonsil. No other lesions in mouth.  Neck: Supple Respiratory: Respiratory effort normal, BS equal bilaterally without rales, rhonchi, wheezing or stridor.  Cardio: RRR with no MRGs. Brisk peripheral pulses without edema.  Abdomen: Soft, + BS.  Non tender. Lymphatics: Non tender without lymphadenopathy.  Skin: Warm, dry without rashes, lesions, ecchymosis.   Dan MakerAshley C Neve Branscomb, NP 3:21 PM [facility]

## 2017-03-05 ENCOUNTER — Ambulatory Visit: Payer: 59 | Admitting: Adult Health

## 2017-03-05 ENCOUNTER — Encounter: Payer: Self-pay | Admitting: Adult Health

## 2017-03-05 VITALS — BP 110/74 | HR 64 | Temp 97.5°F | Ht 65.0 in | Wt 304.8 lb

## 2017-03-05 DIAGNOSIS — A5139 Other secondary syphilis of skin: Secondary | ICD-10-CM

## 2017-03-05 MED ORDER — NYSTATIN 100000 UNIT/ML MT SUSP: OROMUCOSAL | 0 refills | Status: DC

## 2017-03-05 NOTE — Patient Instructions (Signed)
Do salt water gargles for next several days, monitor area for changes or new symptoms.    Oral Thrush, Adult Oral thrush, also called oral candidiasis, is a fungal infection that develops in the mouth and throat and on the tongue. It causes white patches to form on the mouth and tongue. Ginette Pitmanhrush is most common in older adults, but it can occur at any age. Many cases of thrush are mild, but this infection can also be serious. Ginette Pitmanhrush can be a repeated (recurrent) problem for certain people who have a weak body defense system (immune system). The weakness can be caused by chronic illnesses, or by taking medicines that limit the body's ability to fight infection. If a person has difficulty fighting infection, the fungus that causes thrush can spread through the body. This can cause life-threatening blood or organ infections. What are the causes? This condition is caused by a fungus (yeast) called Candida albicans.  This fungus is normally present in small amounts in the mouth and on other mucous membranes. It usually causes no harm.  If conditions are present that allow the fungus to grow without control, it invades surrounding tissues and becomes an infection.  Other Candida species can also lead to thrush (rare).  What increases the risk? This condition is more likely to develop in:  People with a weakened immune system.  Older adults.  People with HIV (human immunodeficiency virus).  People with diabetes.  People with dry mouth (xerostomia).  Pregnant women.  People with poor dental care, especially people who have false teeth.  People who use antibiotic medicines.  What are the signs or symptoms? Symptoms of this condition can vary from mild and moderate to severe and persistent. Symptoms may include:  A burning feeling in the mouth and throat. This can occur at the start of a thrush infection.  White patches that stick to the mouth and tongue. The tissue around the patches may  be red, raw, and painful. If rubbed (during tooth brushing, for example), the patches and the tissue of the mouth may bleed easily.  A bad taste in the mouth or difficulty tasting foods.  A cottony feeling in the mouth.  Pain during eating and swallowing.  Poor appetite.  Cracking at the corners of the mouth.  How is this diagnosed? This condition is diagnosed based on:  Physical exam. Your health care provider will look in your mouth.  Health history. Your health care provider will ask you questions about your health.  How is this treated? This condition is treated with medicines called antifungals, which prevent the growth of fungi. These medicines are either applied directly to the affected area (topical) or swallowed (oral). The treatment will depend on the severity of the condition. Mild thrush Mild cases of thrush may clear up with the use of an antifungal mouth rinse or lozenges. Treatment usually lasts about 14 days. Moderate to severe thrush  More severe thrush infections that have spread to the esophagus are treated with an oral antifungal medicine. A topical antifungal medicine may also be used.  For some severe infections, treatment may need to continue for more than 14 days.  Oral antifungal medicines are rarely used during pregnancy because they may be harmful to the unborn child. If you are pregnant, talk with your health care provider about options for treatment. Persistent or recurrent thrush For cases of thrush that do not go away or keep coming back:  Treatment may be needed twice as long as the  symptoms last.  Treatment will include both oral and topical antifungal medicines.  People with a weakened immune system can take an antifungal medicine on a continuous basis to prevent thrush infections.  It is important to treat conditions that make a person more likely to get thrush, such as diabetes or HIV. Follow these instructions at home: Medicines  Take  over-the-counter and prescription medicines only as told by your health care provider.  Talk with your health care provider about an over-the-counter medicine called gentian violet, which kills bacteria and fungi. Relieving soreness and discomfort To help reduce the discomfort of thrush:  Drink cold liquids such as water or iced tea.  Try flavored ice treats or frozen juices.  Eat foods that are easy to swallow, such as gelatin, ice cream, or custard.  Try drinking from a straw if the patches in your mouth are painful.  General instructions  Eat plain, unflavored yogurt as directed by your health care provider. Check the label to make sure the yogurt contains live cultures. This yogurt can help healthy bacteria to grow in the mouth and can stop the growth of the fungus that causes thrush.  If you wear dentures, remove the dentures before going to bed, brush them vigorously, and soak them in a cleaning solution as directed by your health care provider.  Rinse your mouth with a warm salt-water mixture several times a day. To make a salt-water mixture, completely dissolve 1/2-1 tsp of salt in 1 cup of warm water. Contact a health care provider if:  Your symptoms are getting worse or are not improving within 7 days of starting treatment.  You have symptoms of a spreading infection, such as white patches on the skin outside of the mouth. This information is not intended to replace advice given to you by your health care provider. Make sure you discuss any questions you have with your health care provider. Document Released: 01/09/2004 Document Revised: 01/08/2016 Document Reviewed: 01/08/2016 Elsevier Interactive Patient Education  2017 [employer]

## 2017-05-30 ENCOUNTER — Other Ambulatory Visit: Payer: Self-pay

## 2017-05-30 MED ORDER — NORETHINDRONE ACET-ETHINYL EST 1.5-30 MG-MCG PO TABS: 1.0000 | ORAL_TABLET | Freq: Every day | ORAL | 11 refills | Status: DC

## 2017-07-07 ENCOUNTER — Encounter: Payer: Self-pay | Admitting: Internal Medicine

## 2017-07-24 NOTE — Progress Notes (Deleted)
Complete Physical  Assessment and Plan:  Discussed med's effects and SE's. Screening labs and tests as requested with regular follow-up as recommended. Over 40 minutes of exam, counseling, chart review, and complex, high level critical decision making was performed this visit.   HPI  22 y.o. female  presents for a complete physical and follow up for does not have a problem list on file.Marland Kitchen.  Her blood pressure {HAS HAS NOT:18834} been controlled at home, today their BP is   She {DOES_DOES QIO:96295}OT:18564} workout. She denies chest pain, shortness of breath, dizziness.   She {ACTION; IS/IS [id number]}OT:[id number]} on cholesterol medication and denies myalgias. Her cholesterol {ACTION; IS/IS NOT:21021397} at goal. The cholesterol last visit was:   Lab Results  Component Value Date   CHOL 243 (H) 07/01/2016   HDL 43 (L) 07/01/2016   LDLCALC 166 (H) 07/01/2016   TRIG 168 (H) 07/01/2016   CHOLHDL 5.7 (H) 07/01/2016    Last A1C in the office was:  Lab Results  Component Value Date   HGBA1C 5.4 07/01/2016   Last GFR: Lab Results  Component Value Date   GFRNONAA >89 08/19/2016   Patient is on Vitamin D supplement.   Lab Results  Component Value Date   VD25OH 19 (L) 07/01/2016     BMI is There is no height or weight on file to calculate BMI., she is working on diet and exercise. Wt Readings from Last 3 Encounters:  03/05/17 (!) 304 lb 12.8 oz (138.3 kg)  01/20/17 298 lb (135.2 kg)  12/16/16 289 lb 6.4 oz (131.3 kg)    Current Medications:  Current Outpatient Medications on File Prior to Visit  Medication Sig Dispense Refill  . hyoscyamine (LEVSIN SL) 0.125 MG SL tablet Take 1 to 2 tablets 3 to 4 x day if needed for Nausea, vomiting, cramping or diarrhea (Patient not taking: Reported on 03/05/2017) 90 tablet 0  . meloxicam (MOBIC) 15 MG tablet Take one daily with food for 3-4 weeks, can take with tylenol, can not take with aleve, iburpofen, then as needed daily for pain 30 tablet 1  . Norethindrone  Acetate-Ethinyl Estradiol (LOESTRIN 1.5/30, 21,) 1.5-30 MG-MCG tablet Take 1 tablet by mouth daily. 1 Package 11  . nystatin (MYCOSTATIN) 100000 UNIT/ML suspension 5 ml four times a day, retain in mouth as long as possible (Swish and Spit).  Use for 48 hours after symptoms resolve. 80 mL 0   No current facility-administered medications on file prior to visit.    Allergies:  No Known Allergies Medical History:  She does not have a problem list on file. Health Maintenance:   Immunization History  Administered Date(s) Administered  . Tdap 08/19/2016    Tetanus: Pneumovax: Prevnar 13:  Flu vaccine: Zostavax: LMP: Pap: MGM:  DEXA: Colonoscopy: EGD:  Last Dental Exam: Last Eye Exam: Patient Care Team: Lucky CowboyMcKeown, William, MD as PCP - General (Internal Medicine)  Surgical History:  She has a past surgical history that includes Umbilical hernia repair. Family History:  Herfamily history includes Cancer in her paternal grandfather; Diabetes in her paternal grandfather; Heart disease in her maternal grandmother; Hypertension in her father, maternal grandfather, maternal grandmother, mother, paternal grandfather, and paternal grandmother; Kidney disease in her paternal grandfather; Stroke in her maternal grandmother. Social History:  She reports that she has never smoked. She has never used smokeless tobacco. She reports that she drinks alcohol. She reports that she does not use drugs.  Review of Systems: ROS  Physical Exam: Estimated body mass index is  50.72 kg/m as calculated from the following:   Height as of 03/05/17: 5\' 5"  (1.651 m).   Weight as of 03/05/17: 304 lb 12.8 oz (138.3 kg). There were no vitals taken for this visit. General Appearance: Well nourished, in no apparent distress.  Eyes: PERRLA, EOMs, conjunctiva no swelling or erythema, normal fundi and vessels.  Sinuses: No Frontal/maxillary tenderness  ENT/Mouth: Ext aud canals clear, normal light reflex with TMs  without erythema, bulging. Good dentition. No erythema, swelling, or exudate on post pharynx. Tonsils not swollen or erythematous. Hearing normal.  Neck: Supple, thyroid normal. No bruits  Respiratory: Respiratory effort normal, BS equal bilaterally without rales, rhonchi, wheezing or stridor.  Cardio: RRR without murmurs, rubs or gallops. Brisk peripheral pulses without edema.  Chest: symmetric, with normal excursions and percussion.  Breasts: Symmetric, without lumps, nipple discharge, retractions.  Abdomen: Soft, nontender, no guarding, rebound, hernias, masses, or organomegaly.  Lymphatics: Non tender without lymphadenopathy.  Genitourinary:  Musculoskeletal: Full ROM all peripheral extremities,5/5 strength, and normal gait.  Skin: Warm, dry without rashes, lesions, ecchymosis. Neuro: Cranial nerves intact, reflexes equal bilaterally. Normal muscle tone, no cerebellar symptoms. Sensation intact.  Psych: Awake and oriented X 3, normal affect, Insight and Judgment appropriate.   EKG: WNL no ST changes. AORTA SCAN: WNL   Quentin Mulling 8:40 AM [facility]

## 2017-07-28 ENCOUNTER — Encounter: Payer: Self-pay | Admitting: Physician Assistant

## 2017-08-14 ENCOUNTER — Other Ambulatory Visit: Payer: Self-pay | Admitting: Ophthalmology

## 2017-08-14 DIAGNOSIS — G937 Reye's syndrome: Secondary | ICD-10-CM

## 2017-08-18 ENCOUNTER — Other Ambulatory Visit: Payer: Self-pay | Admitting: Ophthalmology

## 2017-08-18 DIAGNOSIS — G932 Benign intracranial hypertension: Secondary | ICD-10-CM

## 2017-08-26 ENCOUNTER — Other Ambulatory Visit: Payer: Self-pay

## 2017-08-30 ENCOUNTER — Other Ambulatory Visit: Payer: Self-pay

## 2017-09-01 ENCOUNTER — Ambulatory Visit
Admission: RE | Admit: 2017-09-01 | Discharge: 2017-09-01 | Disposition: A | Discharge status: Home / Self Care | Payer: 59 | Source: Ambulatory Visit | Attending: Ophthalmology | Admitting: Ophthalmology

## 2017-09-01 VITALS — BP 114/61 | HR 54

## 2017-09-01 DIAGNOSIS — G937 Reye's syndrome: Secondary | ICD-10-CM

## 2017-09-01 NOTE — Discharge Instructions (Signed)

## 2017-09-02 ENCOUNTER — Emergency Department (HOSPITAL_COMMUNITY)
Admission: EM | Admit: 2017-09-02 | Discharge: 2017-09-02 | Disposition: A | Discharge status: Home / Self Care | Payer: 59 | Attending: Emergency Medicine | Admitting: Emergency Medicine

## 2017-09-02 ENCOUNTER — Encounter (HOSPITAL_COMMUNITY): Payer: Self-pay | Admitting: Emergency Medicine

## 2017-09-02 ENCOUNTER — Other Ambulatory Visit: Payer: Self-pay

## 2017-09-02 ENCOUNTER — Emergency Department (HOSPITAL_COMMUNITY): Payer: 59

## 2017-09-02 ENCOUNTER — Telehealth: Payer: Self-pay | Admitting: Interventional Radiology

## 2017-09-02 DIAGNOSIS — R079 Chest pain, unspecified: Secondary | ICD-10-CM | POA: Insufficient documentation

## 2017-09-02 DIAGNOSIS — R51 Headache: Secondary | ICD-10-CM | POA: Diagnosis present

## 2017-09-02 DIAGNOSIS — R519 Headache, unspecified: Secondary | ICD-10-CM

## 2017-09-02 DIAGNOSIS — M542 Cervicalgia: Secondary | ICD-10-CM

## 2017-09-02 MED ORDER — LACTATED RINGERS IV BOLUS
1000.0000 mL | Freq: Once | INTRAVENOUS | Status: AC
  Administered 2017-09-02: 1000 mL via INTRAVENOUS

## 2017-09-02 MED ORDER — BUTALBITAL-APAP-CAFFEINE 50-325-40 MG PO TABS
1.0000 | ORAL_TABLET | Freq: Once | ORAL | Status: AC
  Administered 2017-09-02: 1 via ORAL
  Filled 2017-09-02: qty 1

## 2017-09-02 NOTE — Telephone Encounter (Signed)
Took call from pt. She states headache post LP for which she is hydrating. She also describes L arm and chest tingling and pain. These are new symptoms.  No fever, photophobia, neck stiffness.  Told her HA may be from LP. Flat x24 hours in addition to hydration. Will ask RN to f/u tomorrow.  Told her that L arm and chest symptoms are not expected sx post LP, unsure if causally related. Recommend f/u with PCP or go to ER.  She understood and thanked me.

## 2017-09-02 NOTE — ED Notes (Signed)
Pt states her pain has increased since getting in the wheelchair and going to MRI. Pt states her pain is a level 7 now.

## 2017-09-02 NOTE — ED Triage Notes (Signed)
Spinal tap yesterday to check pressure, referred there from eye doctor. Today complain of neck pain, headache, feels cramping through chest and in left arm.

## 2017-09-02 NOTE — ED Provider Notes (Signed)
Emergency Department Provider Note   I have reviewed the triage vital signs and the nursing notes.   HISTORY  Chief Complaint Headache   HPI Yvette Phelps is a 22 y.o. female with a history of headaches and what sounded like presumed pseudotumor cerebri the presents to the emergency department today with chest pain.  On further history taking it sound like she had actually had her first lumbar puncture done yesterday where she does not have opening pressure of 24 and they closing pressure of 16.  Since then she has had kind of a normal post LP headache but then she is also developed some left-sided neck pain that seems to radiate all the way down her back and then wraps around her back to her chest.  Associate with nausea but no vomiting.  Shortness of breath, diaphoresis, leg swelling or pain elsewhere.  States that she has been resting and drinking fluids it seems to make little bit better but it gets significantly worse when she has to sit up.  The headache also gets worse in that situation.  She has some mild blurry vision is worse when she sits up but fine when she lays flat. No other associated or modifying symptoms.    History reviewed. No pertinent past medical history.  There are no active problems to display for this patient.   Past Surgical History:  Procedure Laterality Date  . UMBILICAL HERNIA REPAIR     Done around age 42    Current Outpatient Rx  . Order #: [id number] Class: Normal  . Order #: [id number] Class: Normal  . Order #: [id number] Class: Normal  . Order #: [id number] Class: Normal    Allergies Patient has no known allergies.  Family History  Problem Relation Age of Onset  . Hypertension Mother   . Hypertension Father   . Heart disease Maternal Grandmother   . Hypertension Maternal Grandmother   . Stroke Maternal Grandmother   . Hypertension Maternal Grandfather   . Hypertension Paternal Grandmother   . Cancer Paternal Grandfather   . Diabetes  Paternal Grandfather   . Kidney disease Paternal Grandfather   . Hypertension Paternal Grandfather     Social History Social History   Tobacco Use  . Smoking status: Never Smoker  . Smokeless tobacco: Never Used  Substance Use Topics  . Alcohol use: Yes    Comment: social drinker  . Drug use: No    Review of Systems  All other systems negative except as documented in the HPI. All pertinent positives and negatives as reviewed in the HPI. ____________________________________________   PHYSICAL EXAM:  VITAL SIGNS: ED Triage Vitals  Enc Vitals Group     BP 09/02/17 1631 (!) 144/83     Pulse Rate 09/02/17 1631 (!) 101     Resp 09/02/17 1631 17     Temp 09/02/17 1631 97.7 F (36.5 C)     Temp Source 09/02/17 1631 Oral     SpO2 09/02/17 1631 98 %     Weight 09/02/17 1632 (!) 320 lb (145.2 kg)     Height 09/02/17 1632 5' 4.5" (1.638 m)     Head Circumference --      Peak Flow --      Pain Score 09/02/17 1633 5     Pain Loc --      Pain Edu? --      Excl. in GC? --     Constitutional: Alert and oriented. Well appearing and in no acute distress. Eyes: Conjunctivae  are normal. PERRL. EOMI. Head: Atraumatic. Nose: No congestion/rhinnorhea. Mouth/Throat: Mucous membranes are moist.  Oropharynx non-erythematous. Neck: No stridor.  No meningeal signs.   Cardiovascular: Normal rate, regular rhythm. Good peripheral circulation. Grossly normal heart sounds.   Respiratory: Normal respiratory effort.  No retractions. Lungs CTAB. Gastrointestinal: Soft and nontender. No distention.  Musculoskeletal: No lower extremity tenderness nor edema. No gross deformities of extremities. Neurologic:  Normal speech and language. No gross focal neurologic deficits are appreciated. No altered mental status, able to give full seemingly accurate history.  Face is symmetric, EOM's intact, pupils equal and reactive, vision intact, tongue and uvula midline without deviation. Upper and Lower extremity  motor 5/5, intact pain perception in distal extremities, 2+ reflexes in biceps, patella and achilles tendons. Able to perform finger to nose normal with both hands. Walks without assistance or evident ataxia.  Skin:  Skin is warm, dry and intact. No rash noted.   ____________________________________________   LABS (all labs ordered are listed, but only abnormal results are displayed)  Labs Reviewed - No data to display ____________________________________________  EKG   EKG Interpretation  Date/Time:    Ventricular Rate:    PR Interval:    QRS Duration:   QT Interval:    QTC Calculation:   R Axis:     Text Interpretation:         ____________________________________________  RADIOLOGY  No results found.  ____________________________________________   PROCEDURES  Procedure(s) performed:   Procedures   ____________________________________________   INITIAL IMPRESSION / ASSESSMENT AND PLAN / ED COURSE  Likely post LP headache.  However I discussed with her ophthalmologist to order the test and it does not sound like she ever had any imaging done prior to the LP and it is unknown if she has a Chiari malformation or other atomic abnormality that could be related to this.  It is possible that she could be herniating that is causing the upper back and neck pain along with the worst headache.  We will plan for MRI, fluids, Fioricet and bed rest at this time  MRI negative. Symptoms somewhat improved. Stable for discharge w/ continued outpatient follow up.     Pertinent labs & imaging results that were available during my care of the patient were reviewed by me and considered in my medical decision making (see chart for details).  ____________________________________________  FINAL CLINICAL IMPRESSION(S) / ED DIAGNOSES  Final diagnoses:  None     MEDICATIONS GIVEN DURING THIS VISIT:  Medications  butalbital-acetaminophen-caffeine (FIORICET, ESGIC) 50-325-40  MG per tablet 1 tablet (has no administration in time range)  lactated ringers bolus 1,000 mL (has no administration in time range)     NEW OUTPATIENT MEDICATIONS STARTED DURING THIS VISIT:  New Prescriptions   No medications on file    Note:  This note was prepared with assistance of Dragon voice recognition software. Occasional wrong-word or sound-a-like substitutions may have occurred due to the inherent limitations of voice recognition software.   Marily Memos, MD 09/02/17 2219

## 2017-09-03 ENCOUNTER — Ambulatory Visit
Admission: RE | Admit: 2017-09-03 | Discharge: 2017-09-03 | Disposition: A | Discharge status: Home / Self Care | Payer: 59 | Source: Ambulatory Visit | Attending: Ophthalmology | Admitting: Ophthalmology

## 2017-09-03 ENCOUNTER — Other Ambulatory Visit: Payer: Self-pay | Admitting: Ophthalmology

## 2017-09-03 DIAGNOSIS — G971 Other reaction to spinal and lumbar puncture: Secondary | ICD-10-CM

## 2017-09-03 LAB — CSF CELL COUNT WITH DIFFERENTIAL
RBC Count, CSF: 16 cells/uL — ABNORMAL HIGH (ref 0–10)
WBC CSF: 0 {cells}/uL (ref 0–5)

## 2017-09-03 LAB — PROTEIN, CSF: TOTAL PROTEIN, CSF: 18 mg/dL (ref 15–45)

## 2017-09-03 LAB — CRYPTOCOCCAL AG, LTX SCR RFLX TITER
Cryptococcal Ag Screen: NOT DETECTED
MICRO NUMBER:: 90549027
SPECIMEN QUALITY:: ADEQUATE

## 2017-09-03 LAB — ILLEGIBLE FAX NUMB

## 2017-09-03 LAB — GLUCOSE, CSF: Glucose, CSF: 56 mg/dL (ref 40–80)

## 2017-09-03 MED ORDER — IOPAMIDOL (ISOVUE-M 200) INJECTION 41%
1.0000 mL | Freq: Once | INTRAMUSCULAR | Status: AC
  Administered 2017-09-03: 1 mL via EPIDURAL

## 2017-09-03 NOTE — Discharge Instructions (Signed)

## 2017-09-03 NOTE — Progress Notes (Signed)
20cc of blood drawn from left AC space for Epidural Blood Patch; site unremarkable.   

## 2017-09-05 ENCOUNTER — Other Ambulatory Visit: Payer: Self-pay

## 2017-09-17 ENCOUNTER — Ambulatory Visit: Payer: 59 | Admitting: Neurology

## 2017-09-17 ENCOUNTER — Encounter: Payer: Self-pay | Admitting: Neurology

## 2017-09-17 VITALS — BP 137/81 | HR 83 | Ht 64.5 in | Wt 330.0 lb

## 2017-09-17 DIAGNOSIS — G43711 Chronic migraine without aura, intractable, with status migrainosus: Secondary | ICD-10-CM | POA: Diagnosis not present

## 2017-09-17 DIAGNOSIS — R51 Headache: Secondary | ICD-10-CM

## 2017-09-17 DIAGNOSIS — R519 Headache, unspecified: Secondary | ICD-10-CM

## 2017-09-17 DIAGNOSIS — G8929 Other chronic pain: Secondary | ICD-10-CM

## 2017-09-17 DIAGNOSIS — G932 Benign intracranial hypertension: Secondary | ICD-10-CM

## 2017-09-17 MED ORDER — ACETAZOLAMIDE 250 MG PO TABS: 250.0000 mg | ORAL_TABLET | Freq: Two times a day (BID) | ORAL | 11 refills | Status: DC

## 2017-09-17 MED ORDER — ACETAZOLAMIDE 250 MG PO TABS
250.0000 mg | ORAL_TABLET | Freq: Two times a day (BID) | ORAL | 11 refills | Status: DC
Start: 2017-09-17 — End: 2020-12-13

## 2017-09-17 NOTE — Patient Instructions (Addendum)
Acetazolamide sustained-release capsules What is this medicine? ACETAZOLAMIDE (a set a ZOLE a mide) is used to treat glaucoma. It is also used to treat and to prevent altitude or mountain sickness. This medicine may be used for other purposes; ask your health care provider or pharmacist if you have questions. COMMON BRAND NAME(S): Diamox, Diamox Sequels What should I tell my health care provider before I take this medicine? They need to know if you have any of these conditions: -diabetes -kidney disease -liver disease -lung disease -an unusual or allergic reaction to acetazolamide, sulfa drugs, other medicines, foods, dyes, or preservatives -pregnant or trying to get pregnant -breast-feeding How should I use this medicine? Take this medicine by mouth with a glass of water. Follow the directions on the prescription label. Take with food or on an empty stomach. Do not crush or chew. Take your doses at regular intervals. Do not take your medicine more often than directed. Do not stop taking except on your doctor's advice. Talk to your pediatrician regarding the use of this medicine in children. Special care may be needed. Patients over 82 years old may have a stronger reaction and need a smaller dose. Overdosage: If you think you have taken too much of this medicine contact a poison control center or emergency room at once. NOTE: This medicine is only for you. Do not share this medicine with others. What if I miss a dose? If you miss a dose, take it as soon as you can. If it is almost time for your next dose, take only that dose. Do not take double or extra doses. What may interact with this medicine? Do not take this medicine with any of the following medications: -methazolamide This medicine may also interact with the following medications: -aspirin and aspirin-like medicines -cyclosporine -lithium -medicine for diabetes -methenamine -other  diuretics -phenytoin -primidone -quinidine -sodium bicarbonate -stimulant medicines like dextroamphetamine This list may not describe all possible interactions. Give your health care provider a list of all the medicines, herbs, non-prescription drugs, or dietary supplements you use. Also tell them if you smoke, drink alcohol, or use illegal drugs. Some items may interact with your medicine. What should I watch for while using this medicine? Visit your doctor or health care professional for regular checks on your progress. You will need blood work done regularly. If you are diabetic, check your blood sugar as directed. You may need to be on a special diet while taking this medicine. Ask your doctor. Also, ask how many glasses of fluid you need to drink a day. You must not get dehydrated. You may get drowsy or dizzy. Do not drive, use machinery, or do anything that needs mental alertness until you know how this medicine affects you. Do not stand or sit up quickly, especially if you are an older patient. This reduces the risk of dizzy or fainting spells. This medicine can make you more sensitive to the sun. Keep out of the sun. If you cannot avoid being in the sun, wear protective clothing and use sunscreen. Do not use sun lamps or tanning beds/booths. What side effects may I notice from receiving this medicine? Side effects that you should report to your doctor or health care professional as soon as possible: -allergic reactions like skin rash, itching or hives, swelling of the face, lips, or tongue -breathing problems -confusion, depression -dark urine -fever -numbness, tingling in hands or feet -redness, blistering, peeling or loosening of the skin, including inside the mouth -ringing in the  ears -seizure -unusually weak or tired -yellowing of the eyes or skin Side effects that usually do not require medical attention (report to your doctor or health care professional if they continue or are  bothersome): -change in taste -diarrhea -headache -loss of appetite -nausea, vomiting -passing urine more often This list may not describe all possible side effects. Call your doctor for medical advice about side effects. You may report side effects to FDA at 1-800-FDA-1088. Where should I keep my medicine? Keep out of the reach of children. Store at room temperature between 20 and 25 degrees C (68 and 77 degrees F). Throw away any unused medicine after the expiration date. NOTE: This sheet is a summary. It may not cover all possible information. If you have questions about this medicine, talk to your doctor, pharmacist, or health care provider.  2018 Elsevier/Gold Standard (2007-07-08 11:00:54)   Migraine Headache A migraine headache is an intense, throbbing pain on one side or both sides of the head. Migraines may also cause other symptoms, such as nausea, vomiting, and sensitivity to light and noise. What are the causes? Doing or taking certain things may also trigger migraines, such as:  Alcohol.  Smoking.  Medicines, such as: ? Medicine used to treat chest pain (nitroglycerine). ? Birth control pills. ? Estrogen pills. ? Certain blood pressure medicines.  Aged cheeses, chocolate, or caffeine.  Foods or drinks that contain nitrates, glutamate, aspartame, or tyramine.  Physical activity.  Other things that may trigger a migraine include:  Menstruation.  Pregnancy.  Hunger.  Stress, lack of sleep, too much sleep, or fatigue.  Weather changes.  What increases the risk? The following factors may make you more likely to experience migraine headaches:  Age. Risk increases with age.  Family history of migraine headaches.  Being Caucasian.  Depression and anxiety.  Obesity.  Being a woman.  Having a hole in the heart (patent foramen ovale) or other heart problems.  What are the signs or symptoms? The main symptom of this condition is pulsating or  throbbing pain. Pain may:  Happen in any area of the head, such as on one side or both sides.  Interfere with daily activities.  Get worse with physical activity.  Get worse with exposure to bright lights or loud noises.  Other symptoms may include:  Nausea.  Vomiting.  Dizziness.  General sensitivity to bright lights, loud noises, or smells.  Before you get a migraine, you may get warning signs that a migraine is developing (aura). An aura may include:  Seeing flashing lights or having blind spots.  Seeing bright spots, halos, or zigzag lines.  Having tunnel vision or blurred vision.  Having numbness or a tingling feeling.  Having trouble talking.  Having muscle weakness.  How is this diagnosed? A migraine headache can be diagnosed based on:  Your symptoms.  A physical exam.  Tests, such as CT scan or MRI of the head. These imaging tests can help rule out other causes of headaches.  Taking fluid from the spine (lumbar puncture) and analyzing it (cerebrospinal fluid analysis, or CSF analysis).  How is this treated? A migraine headache is usually treated with medicines that:  Relieve pain.  Relieve nausea.  Prevent migraines from coming back.  Treatment may also include:  Acupuncture.  Lifestyle changes like avoiding foods that trigger migraines.  Follow these instructions at home: Medicines  Take over-the-counter and prescription medicines only as told by your health care provider.  Do not drive or use  heavy machinery while taking prescription pain medicine.  To prevent or treat constipation while you are taking prescription pain medicine, your health care provider may recommend that you: ? Drink enough fluid to keep your urine clear or pale yellow. ? Take over-the-counter or prescription medicines. ? Eat foods that are high in fiber, such as fresh fruits and vegetables, whole grains, and beans. ? Limit foods that are high in fat and processed  sugars, such as fried and sweet foods. Lifestyle  Avoid alcohol use.  Do not use any products that contain nicotine or tobacco, such as cigarettes and e-cigarettes. If you need help quitting, ask your health care provider.  Get at least 8 hours of sleep every night.  Limit your stress. General instructions   Keep a journal to find out what may trigger your migraine headaches. For example, write down: ? What you eat and drink. ? How much sleep you get. ? Any change to your diet or medicines.  If you have a migraine: ? Avoid things that make your symptoms worse, such as bright lights. ? It may help to lie down in a dark, quiet room. ? Do not drive or use heavy machinery. ? Ask your health care provider what activities are safe for you while you are experiencing symptoms.  Keep all follow-up visits as told by your health care provider. This is important. Contact a health care provider if:  You develop symptoms that are different or more severe than your usual migraine symptoms. Get help right away if:  Your migraine becomes severe.  You have a fever.  You have a stiff neck.  You have vision loss.  Your muscles feel weak or like you cannot control them.  You start to lose your balance often.  You develop trouble walking.  You faint. This information is not intended to replace advice given to you by your health care provider. Make sure you discuss any questions you have with your health care provider. Document Released: 04/15/2005 Document Revised: 11/03/2015 Document Reviewed: 10/02/2015 Elsevier Interactive Patient Education  2017 [employer]  Idiopathic Intracranial Hypertension Idiopathic intracranial hypertension (IIH) is a condition that increases pressure around the brain. The fluid that surrounds the brain and spinal cord (cerebrospinal fluid, CSF) increases and causes the pressure. Idiopathic means that the cause of this condition is not known. IIH affects  the brain and spinal cord (is a neurological disorder). If this condition is not treated, it can cause vision loss or blindness. What increases the risk? You are more likely to develop this condition if:  You are severely overweight (obese).  You are a woman who has not gone through menopause.  You take certain medicines, such as birth control or steroids.  What are the signs or symptoms? Symptoms of IIH include:  Headaches. This is the most common symptom.  Pain in the shoulders or neck.  Nausea and vomiting.  A "rushing water" or pulsing sound within the ears (pulsatile tinnitus).  Double vision.  Blurred vision.  Brief episodes of complete vision loss.  How is this diagnosed? This condition may be diagnosed based on:  Your symptoms.  Your medical history.  CT scan of the brain.  MRI of the brain.  Magnetic resonance venogram (MRV) to check veins in the brain.  Diagnostic lumbar puncture. This is a procedure to remove and examine a sample of cerebrospinal fluid. This procedure can determine whether too much fluid may be causing IIH.  A thorough eye exam to check  for swelling or nerve damage in the eyes.  How is this treated? Treatment for this condition depends on your symptoms. The goal of treatment is to decrease the pressure around your brain. Common treatments include:  Medicines to decrease the production of spinal fluid and lower the pressure within your skull.  Medicines to prevent or treat headaches.  Surgery to place drains (shunts) in your brain to remove excess fluid.  Lumbar puncture to remove excess cerebrospinal fluid.  Follow these instructions at home:  If you are overweight or obese, work with your health care provider to lose weight.  Take over-the-counter and prescription medicines only as told by your health care provider.  Do not drive or use heavy machinery while taking medicines that can make you sleepy.  Keep all follow-up  visits as told by your health care provider. This is important. Contact a health care provider if:  You have changes in your vision, such as: ? Double vision. ? Not being able to see colors (color vision). Get help right away if:  You have any of the following symptoms and they get worse or do not get better. ? Headaches. ? Nausea. ? Vomiting. ? Vision changes or difficulty seeing. Summary  Idiopathic intracranial hypertension (IIH) is a condition that increases pressure around the brain. The cause is not known (is idiopathic).  The most common symptom of IIH is headaches.  Treatment may include medicines or surgery to relieve the pressure on your brain. This information is not intended to replace advice given to you by your health care provider. Make sure you discuss any questions you have with your health care provider. Document Released: 06/24/2001 Document Revised: 03/06/2016 Document Reviewed: 03/06/2016 Elsevier Interactive Patient Education  2017 [employer].

## 2017-09-17 NOTE — Progress Notes (Signed)
[facility]    Provider:  Dr Lucia Gaskins Referring Provider: Lucky Cowboy, MD Primary Care Physician:  Lucky Cowboy, MD  CC:  Headache  HPI:  Yvette Phelps is a 22 y.o. female here as a referral from Dr. Oneta Rack for headache. She was seen by ophthalmology who noted possible ONH edema and LP was mildly elevated at 24. MRI was unremarkable. She has daily headaches and can be all day long. It can be dull and can be severe. Headaches since she was "little" diagnosed with migraines as a child. Father with migraines. Headaches are a pressure all around the head. When it is very bad it is throbbing which is not "super often". It is every day dull, she can go about her life, no light sensitivity but used to have that significantly in the past. Strong chemicals can worsen a headache. Unknown triggers. Ongoing daily for the past 3 years. She takes ibuprofen 3x a week. No aura. No medication overuse. No long term prednisone, no IUD, no antibiotics or retin A or accutane. She gets a lot of eye pain in the left eye. She snores very heavily and some days very tired and often wakes up with headaches.   Reviewed notes, labs and imaging from outside physicians, which showed:  Patient was seen in the emergency room Sep 02, 2017.  She has a history of headaches and possibly pseudotumor cerebri.  Opening pressure was 24 the day before the closing pressure was 16.  She had a normal post LP headache but also developed some left-sided neck pain.  Mild blurry vision worse when she sits up.  Diagnosed with post LP headache.  An MRI was completed in the emergency room as below which was unremarkable.  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  MRI 08/2017 showed No acute intracranial abnormalities including mass  lesion or mass effect, hydrocephalus, extra-axial fluid collection, midline shift, hemorrhage, or acute infarction, large ischemic events (personally reviewed images)   LP 08/2017 unremarkable csf TSH 07/2016 normal  Review of Systems: Patient complains of symptoms per HPI as well as the following symptoms: headache. Pertinent negatives and positives per HPI. All others negative.   Social History   Socioeconomic History  . Marital status: Single    Spouse name: Not on file  . Number of children: Not on file  . Years of education: Not on file  . Highest education level: Not on file  Occupational History  . Not on file  Social Needs  . Financial resource strain: Not on file  . Food insecurity:    Worry: Not on file    Inability: Not on file  . Transportation needs:    Medical: Not on file    Non-medical: Not on file  Tobacco Use  . Smoking status: Never Smoker  . Smokeless tobacco: Never Used  Substance and Sexual Activity  . Alcohol use: Yes    Comment: social drinker  . Drug use: No  . Sexual activity: Never  Lifestyle  . Physical activity:    Days per week: Not on file    Minutes per session: Not on file  . Stress: Not on file  Relationships  . Social connections:    Talks on phone: Not on file    Gets together: Not on file    Attends religious service: Not on  file    Active member of club or organization: Not on file    Attends meetings of clubs or organizations: Not on file    Relationship status: Not on file  . Intimate partner violence:    Fear of current or ex partner: Not on file    Emotionally abused: Not on file    Physically abused: Not on file    Forced sexual activity: Not on file  Other Topics Concern  . Not on file  Social History Narrative  . Not on file    Family History  Problem Relation Age of Onset  . Hypertension Mother   . Hypertension Father   . Heart disease Maternal Grandmother   . Hypertension Maternal Grandmother   . Stroke  Maternal Grandmother   . Hypertension Maternal Grandfather   . Hypertension Paternal Grandmother   . Cancer Paternal Grandfather   . Diabetes Paternal Grandfather   . Kidney disease Paternal Grandfather   . Hypertension Paternal Grandfather     History reviewed. No pertinent past medical history.  Past Surgical History:  Procedure Laterality Date  . UMBILICAL HERNIA REPAIR     Done around age 51    Current Outpatient Medications  Medication Sig Dispense Refill  . acetaZOLAMIDE (DIAMOX) 250 MG tablet Take 1 tablet (250 mg total) by mouth 2 (two) times daily. 60 tablet 11  . Norethindrone Acetate-Ethinyl Estradiol (LOESTRIN 1.5/30, 21,) 1.5-30 MG-MCG tablet Take 1 tablet by mouth daily. 1 Package 11   No current facility-administered medications for this visit.     Allergies as of 09/17/2017  . (No Known Allergies)    Vitals: BP 137/81 (BP Location: Left Arm, Patient Position: Sitting)   Pulse 83   Ht 5' 4.5" (1.638 m)   Wt (!) 330 lb (149.7 kg)   LMP 08/25/2017   BMI 55.77 kg/m  Last Weight:  Wt Readings from Last 1 Encounters:  09/17/17 (!) 330 lb (149.7 kg)   Last Height:   Ht Readings from Last 1 Encounters:  09/17/17 5' 4.5" (1.638 m)    Physical exam: Exam: Gen: NAD, conversant, well nourised, obese, well groomed                     CV: RRR, no MRG. No Carotid Bruits. No peripheral edema, warm, nontender Eyes: Conjunctivae clear without exudates or hemorrhage  Neuro: Detailed Neurologic Exam  Speech:    Speech is normal; fluent and spontaneous with normal comprehension.  Cognition:    The patient is oriented to person, place, and time;     recent and remote memory intact;     language fluent;     normal attention, concentration,     fund of knowledge Cranial Nerves:    The pupils are equal, round, and reactive to light. Left eye with nasal blurring which appears more like myelinated optic nerve fibers as opposed to edema. Visual fields are full to  finger confrontation. Extraocular movements are intact. Trigeminal sensation is intact and the muscles of mastication are normal. The face is symmetric. The palate elevates in the midline. Hearing intact. Voice is normal. Shoulder shrug is normal. The tongue has normal motion without fasciculations.   Coordination:    Normal finger to nose and heel to shin. Normal rapid alternating movements.   Gait:    Heel-toe and tandem gait are normal.   Motor Observation:    No asymmetry, no atrophy, and no involuntary movements noted. Tone:    Normal muscle  tone.    Posture:    Posture is normal. normal erect    Strength:    Strength is V/V in the upper and lower limbs.      Sensation: intact to LT     Reflex Exam:  DTR's:    Deep tendon reflexes in the upper and lower extremities are normal bilaterally.   Toes:    The toes are downgoing bilaterally.   Clonus:    Clonus is absent.      Assessment/Plan: Very lovely 22 year old with chronic headaches.  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  MRI of the brain was unremarkable did not show any signs of pseudotumor cerebri LP was borderline at 24.  Even though 24 is elevated technically, an opening pressure greater than 25 is more in line with pseudotumor cerebra diagnostic criteria - however can try Acetazolamide which is also a migraine medication.   Migraine vs transformed migraine vs IIH.  Morbid obesity: needs sleep eval for snoring, morning headaches, papilledema would like a sleep study   Start Acetazolamide  bid and follow up in 3-4 weeks with me  Discussed: To prevent or relieve headaches, try the following: Cool Compress. Lie down and place a cool compress on your head.  Avoid headache triggers. If certain  foods or odors seem to have triggered your migraines in the past, avoid them. A headache diary might help you identify triggers.  Include physical activity in your daily routine. Try a daily walk or other moderate aerobic exercise.  Manage stress. Find healthy ways to cope with the stressors, such as delegating tasks on your to-do list.  Practice relaxation techniques. Try deep breathing, yoga, massage and visualization.  Eat regularly. Eating regularly scheduled meals and maintaining a healthy diet might help prevent headaches. Also, drink plenty of fluids.  Follow a regular sleep schedule. Sleep deprivation might contribute to headaches Consider biofeedback. With this mind-body technique, you learn to control certain bodily functions - such as muscle tension, heart rate and blood pressure - to prevent headaches or reduce headache pain.    Proceed to emergency room if you experience new or worsening symptoms or symptoms do not resolve, if you have new neurologic symptoms or if headache is severe, or for any concerning symptom.   Provided education and documentation from American headache Society toolbox including articles on: chronic migraine medication overuse headache, chronic migraines, prevention of migraines, behavioral and other nonpharmacologic treatments for headache.  Orders Placed This Encounter  Procedures  . Ambulatory referral to Sleep Studies    Discussed IIH, association with obesity, risk of permament vision loss. Recommended the healthy weight and wellness center.    Naomie Dean, MD  [facility] [address] [place], [place] [address]  Phone [phone] Fax [phone]

## 2017-09-18 ENCOUNTER — Encounter: Payer: Self-pay | Admitting: Neurology

## 2017-09-18 DIAGNOSIS — G43711 Chronic migraine without aura, intractable, with status migrainosus: Secondary | ICD-10-CM | POA: Insufficient documentation

## 2017-09-23 ENCOUNTER — Encounter: Payer: Self-pay | Admitting: Neurology

## 2017-10-14 ENCOUNTER — Encounter: Payer: Self-pay | Admitting: Neurology

## 2017-12-22 ENCOUNTER — Institutional Professional Consult (permissible substitution): Payer: 59 | Admitting: Neurology

## 2018-04-11 ENCOUNTER — Other Ambulatory Visit: Payer: Self-pay | Admitting: Adult Health

## 2018-04-23 ENCOUNTER — Other Ambulatory Visit: Payer: Self-pay | Admitting: Physician Assistant

## 2018-04-23 MED ORDER — NORETHINDRONE ACET-ETHINYL EST 1.5-30 MG-MCG PO TABS: ORAL_TABLET | ORAL | 0 refills | Status: DC

## 2018-07-10 ENCOUNTER — Other Ambulatory Visit: Payer: Self-pay | Admitting: Physician Assistant

## 2019-04-11 IMAGING — MR MR HEAD W/O CM
8 of 10 series · 42 of 48 positions shown · non-contrast
Comparison: None.

CLINICAL DATA: Severe headache.  Lumbar puncture yesterday.

EXAM:
MRI HEAD WITHOUT CONTRAST
TECHNIQUE: Multiplanar, multiecho pulse sequences of the brain and surrounding
structures were obtained without intravenous contrast.

[Series 3: DWI · axial · 3.0mm · 0.70mm/px · z∈[-126,+36]mm · 7 of 56 slices shown (1 of 4)]
[im 1/56]
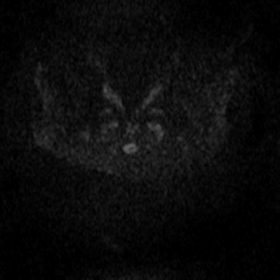
[im 10/56]
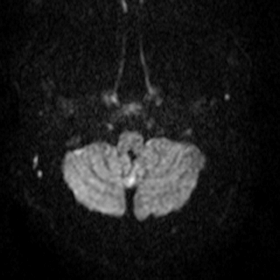
[im 19/56]
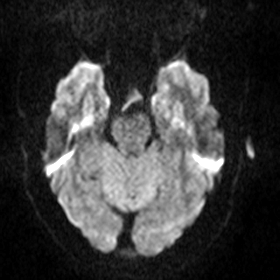
[im 28/56]
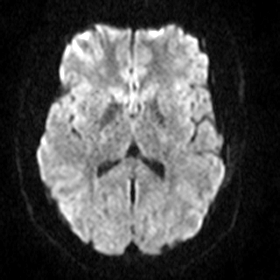
[im 37/56]
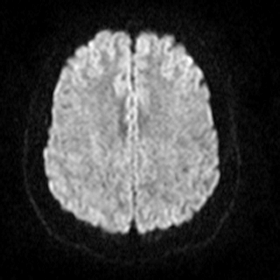
[im 46/56]
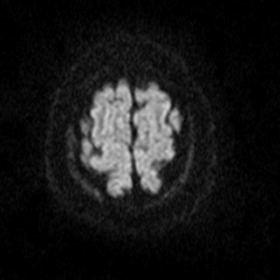
[im 56/56]
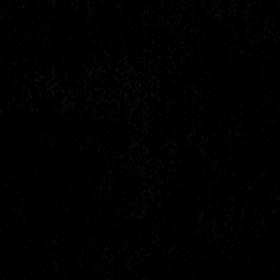

[Series 4: DWI · axial · 3.0mm · 0.75mm/px · z∈[-126,+33]mm · 7 of 54 slices shown (2 of 4)]
[im 1/54]
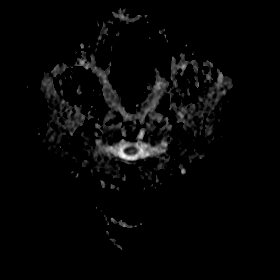
[im 9/54]
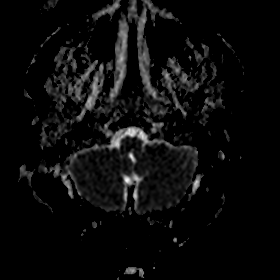
[im 18/54]
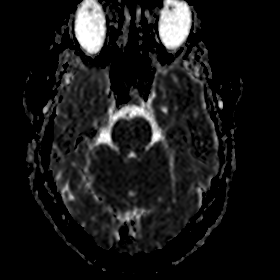
[im 27/54]
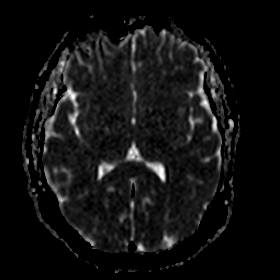
[im 36/54]
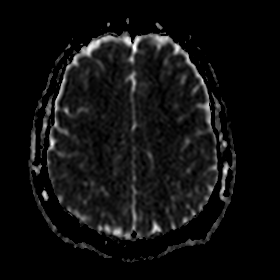
[im 45/54]
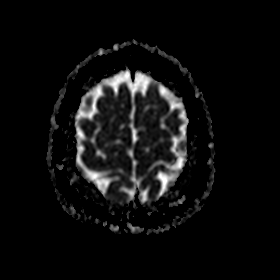
[im 54/54]
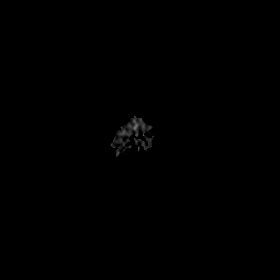

[Series 5: DWI · coronal · 5.0mm · 0.46mm/px · 4 of 34 slices shown (3 of 4)]
[im 1/34]
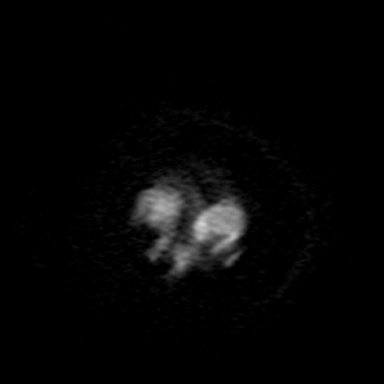
[im 12/34]
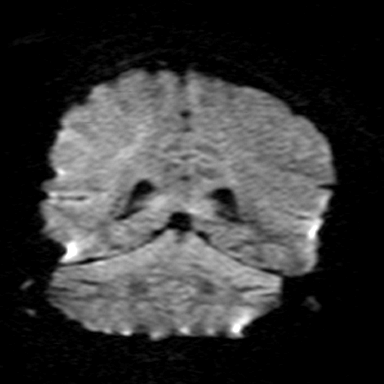
[im 23/34]
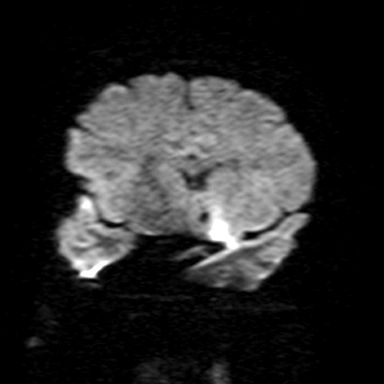
[im 34/34]
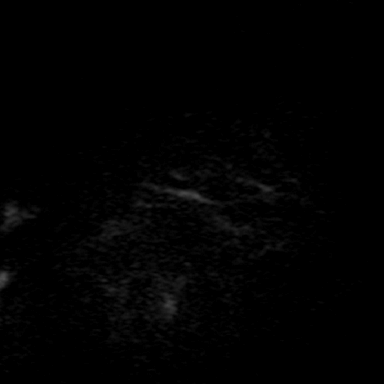

[Series 6: DWI · coronal · 5.0mm · 0.50mm/px · 4 of 34 slices shown (4 of 4)]
[im 1/34]
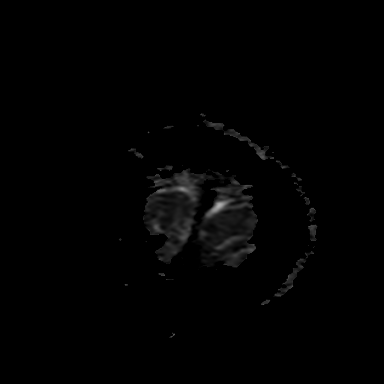
[im 12/34]
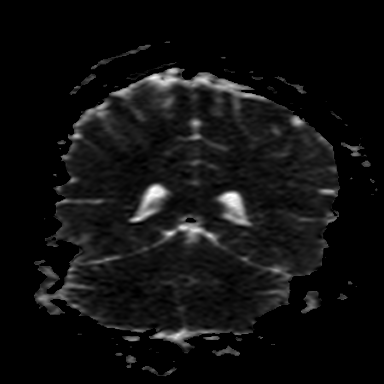
[im 23/34]
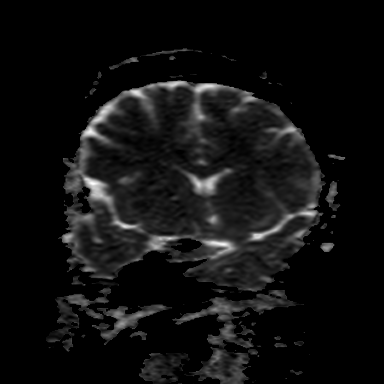
[im 34/34]
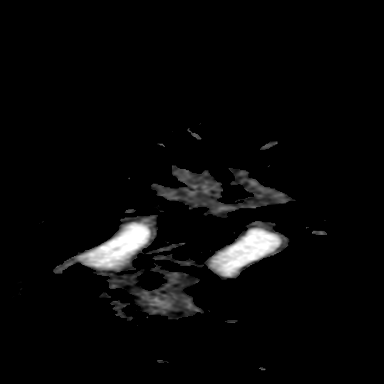

[Series 7: T2 · axial · 5.0mm · 0.66mm/px · z∈[-116,+26]mm · 3 of 23 slices shown (1 of 2)]
[im 1/23]
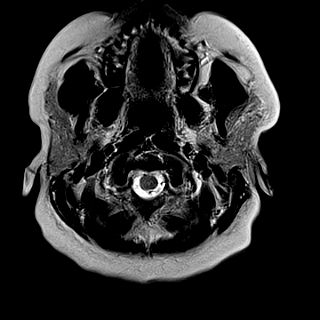
[im 12/23]
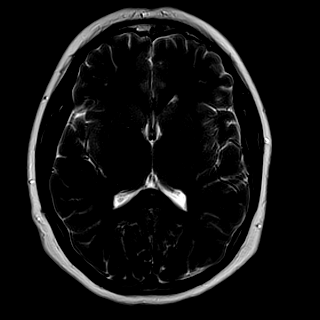
[im 23/23]
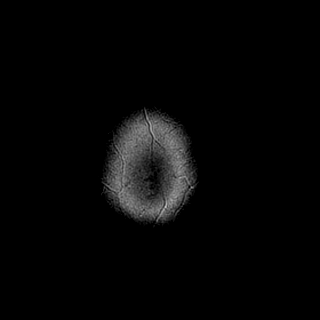

[Series 8: FLAIR · axial · 3.0mm · 0.81mm/px · z∈[-114,+24]mm · 6 of 47 slices shown]
[im 1/47]
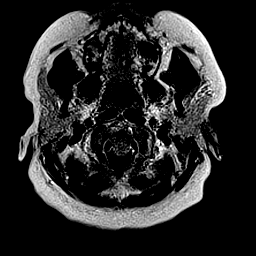
[im 10/47]
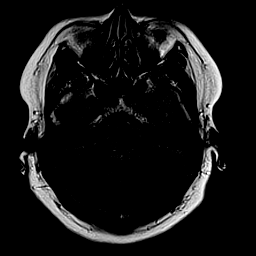
[im 19/47]
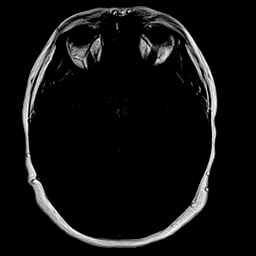
[im 28/47]
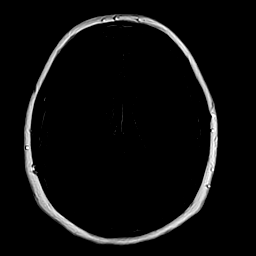
[im 37/47]
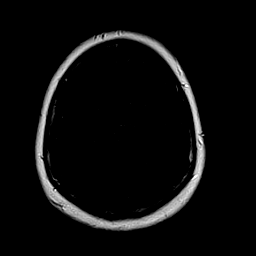
[im 47/47]
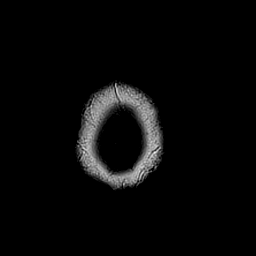

[Series 9: T1 · axial · 2.0mm · 0.42mm/px · z∈[-126,+27]mm · 8 of 78 slices shown]
[im 1/78]
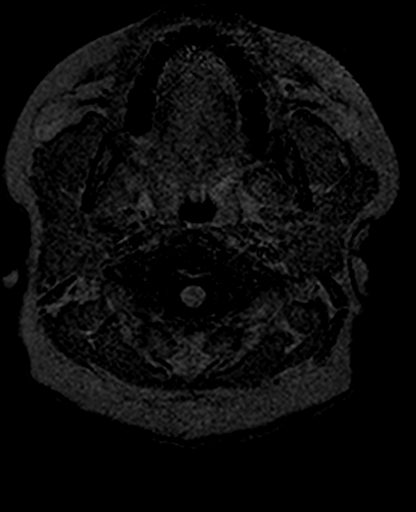
[im 10/78]
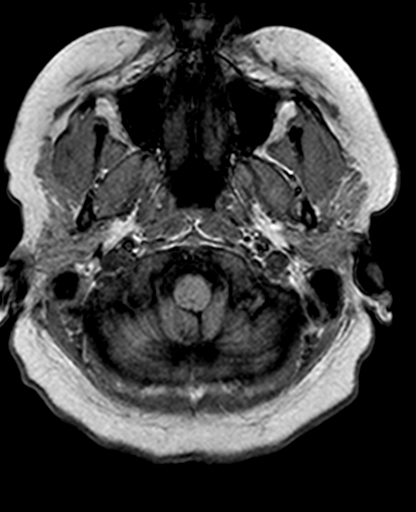
[im 20/78]
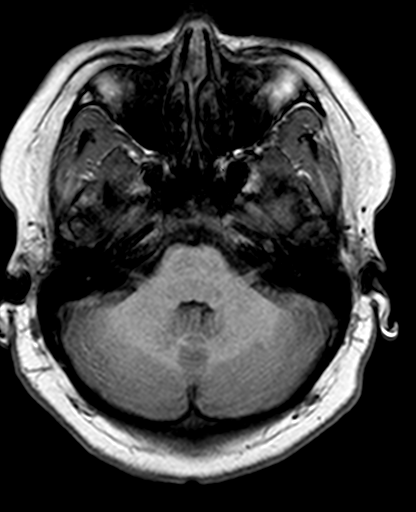
[im 29/78]
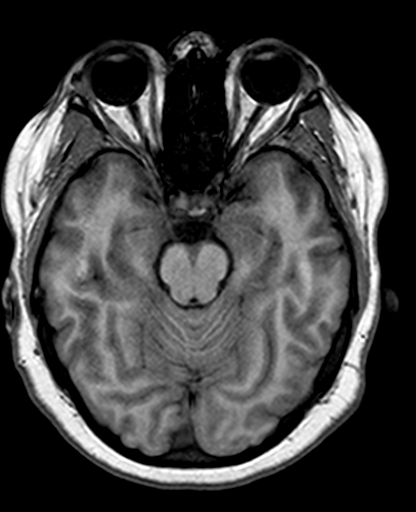
[im 49/78]
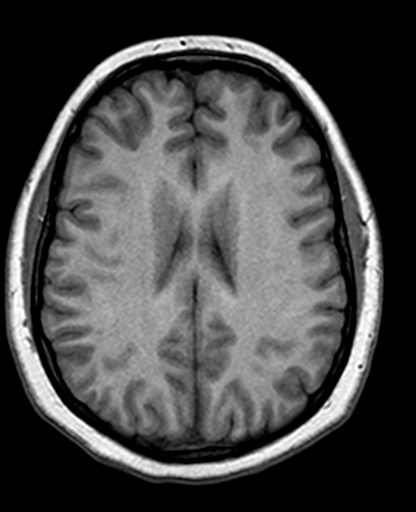
[im 58/78]
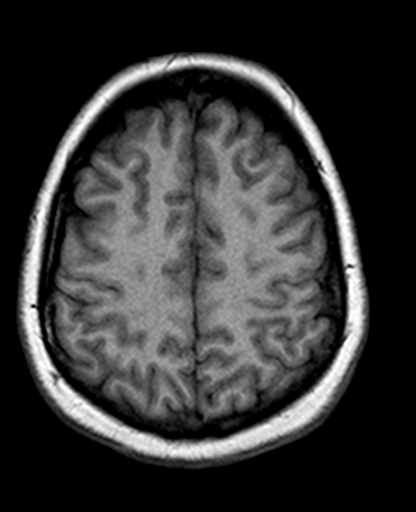
[im 68/78]
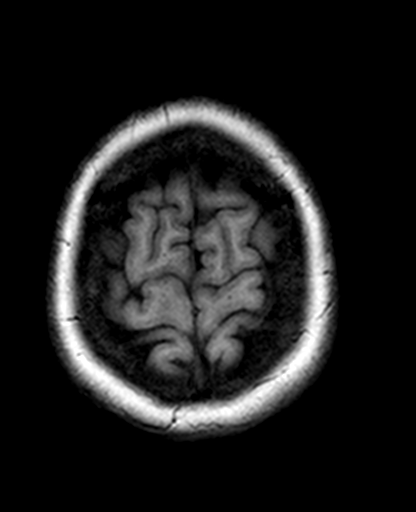
[im 78/78]
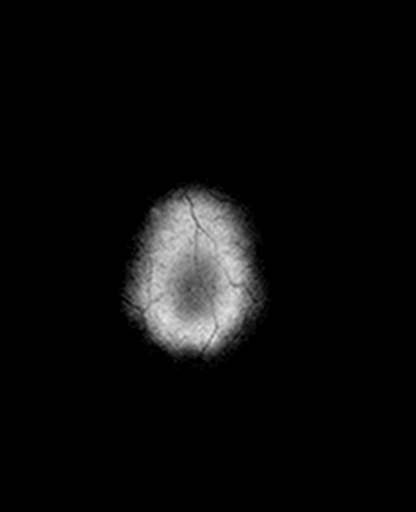

[Series 11: T2 · coronal · 5.0mm · 0.59mm/px · 3 of 28 slices shown (2 of 2)]
[im 1/28]
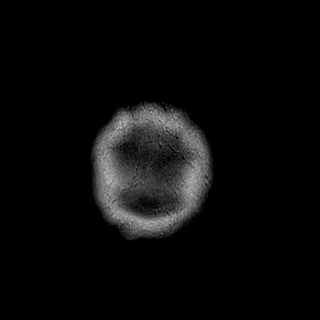
[im 14/28]
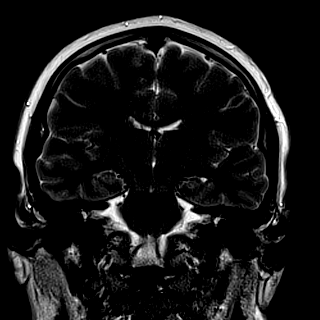
[im 28/28]
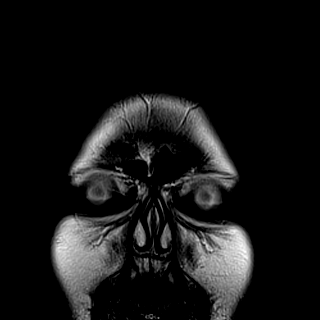

[42 of 48 positions shown; findings below may reference images not displayed]

FINDINGS: BRAIN: The midline structures are normal. There is no acute infarct
or acute hemorrhage. No mass lesion, hydrocephalus, dural
abnormality or extra-axial collection. The brain parenchymal signal
is normal for the patient's age. No age-advanced or lobar
predominant atrophy. No chronic microhemorrhage or superficial
siderosis.

VASCULAR: Major intracranial arterial and venous sinus flow voids
are preserved.

SKULL AND UPPER CERVICAL SPINE: The visualized skull base,
calvarium, upper cervical spine and extracranial soft tissues are
normal.

SINUSES/ORBITS: No fluid levels or advanced mucosal thickening. No
mastoid or middle ear effusion. Normal orbits.
IMPRESSION: Normal MRI of the brain.

## 2020-03-03 ENCOUNTER — Ambulatory Visit: Payer: 59 | Admitting: Physician Assistant

## 2020-12-13 ENCOUNTER — Ambulatory Visit
Admission: RE | Admit: 2020-12-13 | Discharge: 2020-12-13 | Disposition: A | Discharge status: Home / Self Care | Payer: 59 | Source: Ambulatory Visit | Attending: Family Medicine | Admitting: Family Medicine

## 2020-12-13 ENCOUNTER — Other Ambulatory Visit: Payer: Self-pay

## 2020-12-13 VITALS — BP 130/78 | HR 95 | Temp 97.9°F | Resp 20

## 2020-12-13 DIAGNOSIS — R053 Chronic cough: Secondary | ICD-10-CM

## 2020-12-13 MED ORDER — AZITHROMYCIN 250 MG PO TABS: 250.0000 mg | ORAL_TABLET | Freq: Every day | ORAL | 0 refills | Status: DC

## 2020-12-13 MED ORDER — BENZONATATE 100 MG PO CAPS: ORAL_CAPSULE | ORAL | 0 refills | Status: DC

## 2020-12-13 NOTE — ED Triage Notes (Signed)
Pt states she has had cough for past 3 weeks, has tested negative for covid but was treated for strep , cough has persisted

## 2020-12-14 NOTE — ED Provider Notes (Signed)
  [name]   [id number] 12/13/20 Arrival Time: 1650  ASSESSMENT & PLAN:  1. Persistent cough for 3 weeks or longer    No x-ray services available here today. Given duration of cough will tx with below. No resp distress.  Meds ordered this encounter  Medications   benzonatate (TESSALON) 100 MG capsule    Sig: Take 1 capsule by mouth every 8 (eight) hours for cough.    Dispense:  21 capsule    Refill:  0   azithromycin (ZITHROMAX) 250 MG tablet    Sig: Take 1 tablet (250 mg total) by mouth daily. Take first 2 tablets together, then 1 every day until finished.    Dispense:  6 tablet    Refill:  0     Follow-up Information     Lucky Cowboy, MD.   Specialty: Internal Medicine Why: If worsening or failing to improve as anticipated. Contact information: [address] [place] [address] [phone]                 Reviewed expectations re: course of current medical issues. Questions answered. Outlined signs and symptoms indicating need for more acute intervention. Understanding verbalized. After Visit Summary given.   SUBJECTIVE: History from: patient. Yvette Phelps is a 25 y.o. female who reports dry persistent cough; x 3-4 weeks; no SOB/wheezing. Non-smoker. Afebrile. Normal PO intake without n/v/d.   OBJECTIVE:  Vitals:   12/13/20 1657  BP: 130/78  Pulse: 95  Resp: 20  Temp: 97.9 F (36.6 C)  SpO2: 98%    General appearance: alert; no distress Eyes: PERRLA; EOMI; conjunctiva normal HENT: [place]; AT; without nasal congestion Neck: supple  Lungs: speaks full sentences without difficulty; unlabored; CTAB; dry cough Extremities: no edema Skin: warm and dry Neurologic: normal gait Psychological: alert and cooperative; normal mood and affect    No Known Allergies  History reviewed. No pertinent past medical history. Social History   Socioeconomic History   Marital status: Single    Spouse name: Not on  file   Number of children: Not on file   Years of education: Not on file   Highest education level: Not on file  Occupational History   Not on file  Tobacco Use   Smoking status: Never   Smokeless tobacco: Never  Substance and Sexual Activity   Alcohol use: Yes    Comment: social drinker   Drug use: No   Sexual activity: Never  Other Topics Concern   Not on file  Social History Narrative   Not on file   Social Determinants of Health   Financial Resource Strain: Not on file  Food Insecurity: Not on file  Transportation Needs: Not on file  Physical Activity: Not on file  Stress: Not on file  Social Connections: Not on file  Intimate Partner Violence: Not on file   Family History  Problem Relation Age of Onset   Hypertension Mother    Hypertension Father    Heart disease Maternal Grandmother    Hypertension Maternal Grandmother    Stroke Maternal Grandmother    Hypertension Maternal Grandfather    Hypertension Paternal Grandmother    Cancer Paternal Grandfather    Diabetes Paternal Grandfather    Kidney disease Paternal Grandfather    Hypertension Paternal Grandfather    Past Surgical History:  Procedure Laterality Date   UMBILICAL HERNIA REPAIR     Done around age 69     Mardella Layman, MD 12/14/20 1114

## 2022-08-08 ENCOUNTER — Ambulatory Visit
Admission: EM | Admit: 2022-08-08 | Discharge: 2022-08-08 | Disposition: A | Discharge status: Home / Self Care | Payer: Self-pay | Attending: Nurse Practitioner | Admitting: Nurse Practitioner

## 2022-08-08 ENCOUNTER — Encounter: Payer: Self-pay | Admitting: Emergency Medicine

## 2022-08-08 DIAGNOSIS — R42 Dizziness and giddiness: Secondary | ICD-10-CM

## 2022-08-08 MED ORDER — MECLIZINE HCL 12.5 MG PO TABS: 12.5000 mg | ORAL_TABLET | Freq: Three times a day (TID) | ORAL | 0 refills | Status: DC | PRN

## 2022-08-08 NOTE — Discharge Instructions (Addendum)
As we discussed, it sounds like you have vertigo  You can take the meclizine every 8 hours as needed for dizziness.  Please make sure you are increasing water intake.  With no benefit from the meclizine after 24 hours of use, you can try the Epley maneuver.  Handout is at the back of this packet.

## 2022-08-08 NOTE — ED Triage Notes (Signed)
Headache that started on Tuesday.  States lips and right fingers were tingly.  States she vomited once on Tuesday.  States today she feels dizzy.  States when she turns her head too fast, symptoms are worse.

## 2022-08-08 NOTE — ED Provider Notes (Signed)
[facility]    CSN: [id number] Arrival date & time: 08/08/22  [id number]      History   Chief Complaint No chief complaint on file.   HPI Yvette Phelps is a 27 y.o. female.   Patient presents today for 2-day history of headache.  Reports initially, pain was behind her eyes and was much more severe.  Reports her lips and a couple of her fingers on her right hand were tingling.  Reports she vomited on Tuesday, however none since.  She took ibuprofen on Tuesday which helped a little bit with the pain and then slept most of the day which did seem to help as well.  Reports the headache was better yesterday and is even better today.  Currently rates her headache as a 1 out of 10 and describes as a "dull pressure" in her temples.  She denies vision changes, drooping of her face, changes in ability to eat or drink liquids, and drooping of her face.  She denies chest pain, shortness of breath, palpitations, and diaphoresis when the headache was present.  She does report a history of migraines, but has not had any in a number of years.  Reports she woke up this morning and the room was spinning when she sat up in bed and when she tried to put her shoes on.  Reports this sensation makes her feel nauseous, however no vomiting today.  No recent fall, head trauma, cough, congestion, or sore throat.  Does not drink as much water as she used to had per her report.  Has not taken anything for symptoms so far.    History reviewed. No pertinent past medical history.  Patient Active Problem List   Diagnosis Date Noted   Chronic migraine without aura, with intractable migraine, so stated, with status migrainosus 09/18/2017   Morbid obesity 09/18/2017    Past Surgical History:  Procedure Laterality Date   UMBILICAL HERNIA REPAIR     Done around age 69    OB History   No obstetric history on file.      Home Medications    Prior to Admission medications   Medication Sig Start Date End  Date Taking? Authorizing Provider  meclizine (ANTIVERT) 12.5 MG tablet Take 1 tablet (12.5 mg total) by mouth 3 (three) times daily as needed for dizziness. Do not take with alcohol or while driving or operating heavy machinery.  May cause drowsiness. 08/08/22  Yes Valentino NoseMartinez, Monic Engelmann A, NP    Family History Family History  Problem Relation Age of Onset   Hypertension Mother    Hypertension Father    Heart disease Maternal Grandmother    Hypertension Maternal Grandmother    Stroke Maternal Grandmother    Hypertension Maternal Grandfather    Hypertension Paternal Grandmother    Cancer Paternal Grandfather    Diabetes Paternal Grandfather    Kidney disease Paternal Grandfather    Hypertension Paternal Grandfather     Social History Social History   Tobacco Use   Smoking status: Never   Smokeless tobacco: Never  Substance Use Topics   Alcohol use: Yes    Comment: social drinker   Drug use: No     Allergies   Patient has no known allergies.   Review of Systems Review of Systems Per HPI  Physical Exam Triage Vital Signs ED Triage Vitals  Enc Vitals Group     BP 08/08/22 0945 121/72     Pulse Rate 08/08/22 0945 (!) 52  Resp 08/08/22 0945 18     Temp 08/08/22 0945 98.6 F (37 C)     Temp Source 08/08/22 0945 Oral     SpO2 08/08/22 0945 98 %     Weight --      Height --      Head Circumference --      Peak Flow --      Pain Score 08/08/22 0948 0     Pain Loc --      Pain Edu? --      Excl. in GC? --    Orthostatic VS for the past 24 hrs:  BP- Lying Pulse- Lying BP- Sitting Pulse- Sitting BP- Standing at 0 minutes Pulse- Standing at 0 minutes  08/08/22 1001 132/75 56 125/81 68 132/90 69    Updated Vital Signs BP 121/72 (BP Location: Right Arm)   Pulse (!) 52   Temp 98.6 F (37 C) (Oral)   Resp 18   LMP 08/07/2022 (Exact Date)   SpO2 98%   Visual Acuity Right Eye Distance:   Left Eye Distance:   Bilateral Distance:    Right Eye Near:   Left Eye  Near:    Bilateral Near:     Physical Exam Vitals and nursing note reviewed.  Constitutional:      General: She is not in acute distress.    Appearance: Normal appearance. She is not toxic-appearing.  HENT:     Head: Normocephalic and atraumatic.     Right Ear: Tympanic membrane, ear canal and external ear normal. There is no impacted cerumen.     Left Ear: Tympanic membrane, ear canal and external ear normal. There is no impacted cerumen.     Nose: Nose normal. No congestion or rhinorrhea.     Mouth/Throat:     Mouth: Mucous membranes are moist.     Pharynx: Oropharynx is clear. No posterior oropharyngeal erythema.  Eyes:     General: No scleral icterus.    Extraocular Movements: Extraocular movements intact.     Right eye: Normal extraocular motion.     Left eye: Normal extraocular motion.     Pupils: Pupils are equal, round, and reactive to light.  Cardiovascular:     Rate and Rhythm: Normal rate and regular rhythm.  Pulmonary:     Effort: Pulmonary effort is normal. No respiratory distress.     Breath sounds: Normal breath sounds. No wheezing, rhonchi or rales.  Musculoskeletal:     Cervical back: Normal range of motion.  Skin:    General: Skin is warm and dry.     Capillary Refill: Capillary refill takes less than 2 seconds.     Coloration: Skin is not jaundiced or pale.     Findings: No erythema.  Neurological:     General: No focal deficit present.     Mental Status: She is alert and oriented to person, place, and time.     Cranial Nerves: Cranial nerves 2-12 are intact.     Sensory: Sensation is intact.     Motor: Motor function is intact.     Coordination: Coordination is intact. Romberg sign negative. Heel to [facility] Test normal. Rapid alternating movements normal.     Gait: Gait is intact. Gait normal.  Psychiatric:        Behavior: Behavior is cooperative.      UC Treatments / Results  Labs (all labs ordered are listed, but only abnormal results are  displayed) Labs Reviewed - No data to display  EKG   Radiology No results found.  Procedures Procedures (including critical care time)  Medications Ordered in UC Medications - No data to display  Initial Impression / Assessment and Plan / UC Course  I have reviewed the triage vital signs and the nursing notes.  Pertinent labs & imaging results that were available during my care of the patient were reviewed by me and considered in my medical decision making (see chart for details).   Patient is well-appearing, normotensive, afebrile, not tachycardic, not tachypneic, oxygenating well on room air.    1. Vertigo Vital signs and examination today reassuring Symptoms and history are consistent with vertigo Will treat with meclizine, Epley maneuvers, pushing hydration with water Strict ER precautions discussed with patient Note given for work  The patient was given the opportunity to ask questions.  All questions answered to their satisfaction.  The patient is in agreement to this plan.    Final Clinical Impressions(s) / UC Diagnoses   Final diagnoses:  Vertigo     Discharge Instructions      As we discussed, it sounds like you have vertigo  You can take the meclizine every 8 hours as needed for dizziness.  Please make sure you are increasing water intake.  With no benefit from the meclizine after 24 hours of use, you can try the Epley maneuver.  Handout is at the back of this packet.     ED Prescriptions     Medication Sig Dispense Auth. Provider   meclizine (ANTIVERT) 12.5 MG tablet Take 1 tablet (12.5 mg total) by mouth 3 (three) times daily as needed for dizziness. Do not take with alcohol or while driving or operating heavy machinery.  May cause drowsiness. 30 tablet Valentino Nose, NP      PDMP not reviewed this encounter.   Valentino Nose, NP 08/08/22 [id number]

## 2023-07-22 ENCOUNTER — Ambulatory Visit: Payer: Self-pay | Admitting: Obstetrics & Gynecology

## 2023-08-11 ENCOUNTER — Ambulatory Visit: Admitting: Obstetrics and Gynecology

## 2023-09-01 ENCOUNTER — Other Ambulatory Visit (HOSPITAL_COMMUNITY)
Admission: RE | Admit: 2023-09-01 | Discharge: 2023-09-01 | Disposition: A | Discharge status: Home / Self Care | Source: Ambulatory Visit | Attending: Obstetrics and Gynecology | Admitting: Obstetrics and Gynecology

## 2023-09-01 ENCOUNTER — Encounter: Payer: Self-pay | Admitting: Obstetrics and Gynecology

## 2023-09-01 ENCOUNTER — Ambulatory Visit (INDEPENDENT_AMBULATORY_CARE_PROVIDER_SITE_OTHER): Admitting: Obstetrics and Gynecology

## 2023-09-01 VITALS — BP 135/86 | HR 80 | Ht 64.0 in | Wt 374.8 lb

## 2023-09-01 DIAGNOSIS — Z113 Encounter for screening for infections with a predominantly sexual mode of transmission: Secondary | ICD-10-CM | POA: Insufficient documentation

## 2023-09-01 DIAGNOSIS — Z01419 Encounter for gynecological examination (general) (routine) without abnormal findings: Secondary | ICD-10-CM | POA: Insufficient documentation

## 2023-09-01 DIAGNOSIS — Z124 Encounter for screening for malignant neoplasm of cervix: Secondary | ICD-10-CM | POA: Diagnosis not present

## 2023-09-01 DIAGNOSIS — Z1151 Encounter for screening for human papillomavirus (HPV): Secondary | ICD-10-CM | POA: Diagnosis not present

## 2023-09-01 DIAGNOSIS — Z30011 Encounter for initial prescription of contraceptive pills: Secondary | ICD-10-CM

## 2023-09-01 LAB — POCT URINE PREGNANCY: Preg Test, Ur: NEGATIVE

## 2023-09-01 MED ORDER — DROSPIRENONE-ETHINYL ESTRADIOL 3-0.02 MG PO TABS: 1.0000 | ORAL_TABLET | Freq: Every day | ORAL | 11 refills | Status: DC

## 2023-09-01 NOTE — Progress Notes (Signed)
 ANNUAL EXAM Patient name: OLYVIAH KERSHNER MRN [id number]  Date of birth: 18-Oct-1995 Chief Complaint:   Gynecologic Exam  History of Present Illness:   Yvette Phelps is a 28 y.o. G0P0000  female being seen today for a routine annual exam.  Current complaints: no pelvic or vaginal complaints. Cycle is regular, does endorse heavy period. Super plus tampons in an hour the first two days. Has been on birth control before.  Last sexual intercourse one week ago.   Patient's last menstrual period was 08/18/2023.   Upstream - 09/01/23 0835       Pregnancy Intention Screening   Does the patient want to become pregnant in the next year? No    Does the patient's partner want to become pregnant in the next year? No    Would the patient like to discuss contraceptive options today? Yes      Contraception Wrap Up   Current Method No Contraceptive Precautions    Contraception Counseling Provided Yes            The pregnancy intention screening data noted above was reviewed. Potential methods of contraception were discussed. The patient elected to proceed with No data recorded.   Last pap n/a. Results were: N/A. H/O abnormal pap: no Last mammogram: n/a. Results were: N/A. Family h/o breast cancer: no      09/01/2023    8:42 AM  Depression screen PHQ 2/9  Decreased Interest 0  Down, Depressed, Hopeless 0  PHQ - 2 Score 0  Altered sleeping 0  Tired, decreased energy 2  Change in appetite 2  Feeling bad or failure about yourself  0  Trouble concentrating 0  Moving slowly or fidgety/restless 0  Suicidal thoughts 0  PHQ-9 Score 4        09/01/2023    8:42 AM  GAD 7 : Generalized Anxiety Score  Nervous, Anxious, on Edge 1  Control/stop worrying 0  Worry too much - different things 0  Trouble relaxing 0  Restless 0  Easily annoyed or irritable 0  Afraid - awful might happen 0  Total GAD 7 Score 1     Review of Systems:   Pertinent items are noted in HPI Denies any  headaches, blurred vision, fatigue, shortness of breath, chest pain, abdominal pain, abnormal vaginal discharge/itching/odor/irritation, problems with periods, bowel movements, urination, or intercourse unless otherwise stated above. Pertinent History Reviewed:  Reviewed past medical,surgical, social and family history.  Reviewed problem list, medications and allergies. Physical Assessment:   Vitals:   09/01/23 0835 09/01/23 0915  BP: (!) 149/91 135/86  Pulse: 74 80  Weight: (!) 374 lb 12.8 oz (170 kg)   Height: 5\' 4"  (1.626 m)   Body mass index is 64.33 kg/m.        Physical Examination:   General appearance - well appearing, and in no distress  Mental status - alert, oriented   Psych:  She has a normal mood and affect  Skin - warm and dry, normal color, no suspicious lesions noted  Chest - effort normal, all lung fields clear to auscultation bilaterally  Heart - normal rate and regular rhythm  Neck:  midline trachea, no thyromegaly or nodules  Breasts - breasts appear normal, no suspicious masses, no skin or nipple changes or  axillary nodes  Abdomen - soft, nontender, nondistended  Pelvic - VULVA: normal appearing vulva with no masses, tenderness or lesions  VAGINA: normal appearing vagina with normal color and discharge, no lesions  CERVIX: normal appearing cervix without discharge or lesions, no CMT  Thin prep pap is done   UTERUS: uterus is felt to be normal size, shape, consistency and nontender   ADNEXA: No adnexal masses or tenderness noted.  Extremities:  No swelling or varicosities noted  Chaperone present for exam  Results for orders placed or performed in visit on 09/01/23 (from the past 24 hours)  POCT urine pregnancy   Collection Time: 09/01/23  9:14 AM  Result Value Ref Range   Preg Test, Ur Negative Negative    Assessment & Plan:  1. Encounter for annual routine gynecological examination (Primary) Encouraged self breast exams Has not seen PCP in years, will  collect routine labs today Firstp pap discussed and collected today - Cytology - PAP( [facility]) - CBC - Comp Met (CMET) - TSH - Lipid panel  2. Encounter for gynecological examination with Papanicolaou smear of cervix  - Cytology - PAP( [place])  3. Encounter for initial prescription of contraceptive pills - Reviewed different types of birth control available: OCPs,  transdermal patch, Nexplanon, Depo..  We reviewed the advantages and side effects -sexually active** back up method used** Discussed starting first day of menstrual cycle, missed pill window, back up method x 1 week - Patient has tried: OCPs - Patient desires: OCPs Reports migraine when she was younger, not current, no aura, no smoking, no hx VTE, no HTN   - POCT urine pregnancy  4. Screen for STD (sexually transmitted disease)  - RPR+HBsAg+HCVAb+...  Labs/procedures today:   Mammogram: @ 28yo, or sooner if problems   Orders Placed This Encounter  Procedures   RPR+HBsAg+HCVAb+...   CBC   Comp Met (CMET)   TSH   Lipid panel   POCT urine pregnancy    Meds:  Meds ordered this encounter  Medications   drospirenone-ethinyl estradiol (YAZ) 3-0.02 MG tablet    Sig: Take 1 tablet by mouth daily.    Dispense:  28 tablet    Refill:  11    Follow-up: Return in about 3 months (around 12/02/2023), or birth control follo wup.  Susi Eric, FNP

## 2023-09-02 LAB — CYTOLOGY - PAP
Chlamydia: NEGATIVE
Comment: NEGATIVE
Comment: NEGATIVE
Comment: NEGATIVE
Comment: NORMAL
Diagnosis: NEGATIVE
High risk HPV: NEGATIVE
Neisseria Gonorrhea: NEGATIVE
Trichomonas: NEGATIVE

## 2023-09-02 LAB — LIPID PANEL
Chol/HDL Ratio: 5.3 ratio — ABNORMAL HIGH (ref 0.0–4.4)
Cholesterol, Total: 224 mg/dL — ABNORMAL HIGH (ref 100–199)
HDL: 42 mg/dL (ref >39)
LDL Chol Calc (NIH): 149 mg/dL — ABNORMAL HIGH (ref 0–99)
Triglycerides: 181 mg/dL — ABNORMAL HIGH (ref 0–149)
VLDL Cholesterol Cal: 33 mg/dL (ref 5–40)

## 2023-09-03 ENCOUNTER — Encounter: Payer: Self-pay | Admitting: Physician Assistant

## 2023-09-03 LAB — COMPREHENSIVE METABOLIC PANEL WITH GFR
ALT: 17 IU/L (ref 0–32)
AST: 18 IU/L (ref 0–40)
Albumin: 4.2 g/dL (ref 4.0–5.0)
Alkaline Phosphatase: 73 IU/L (ref 44–121)
BUN/Creatinine Ratio: 17 (ref 9–23)
BUN: 14 mg/dL (ref 6–20)
Bilirubin Total: 0.3 mg/dL (ref 0.0–1.2)
CO2: 23 mmol/L (ref 20–29)
Calcium: 9.3 mg/dL (ref 8.7–10.2)
Chloride: 100 mmol/L (ref 96–106)
Creatinine, Ser: 0.81 mg/dL (ref 0.57–1.00)
Globulin, Total: 2.3 g/dL (ref 1.5–4.5)
Glucose: 85 mg/dL (ref 70–99)
Potassium: 4.5 mmol/L (ref 3.5–5.2)
Sodium: 138 mmol/L (ref 134–144)
Total Protein: 6.5 g/dL (ref 6.0–8.5)
eGFR: 101 mL/min/{1.73_m2} (ref >59)

## 2023-09-03 LAB — RPR+HBSAG+HCVAB+...
Hep C Virus Ab: NONREACTIVE
Hepatitis B Surface Ag: NEGATIVE
RPR Ser Ql: NONREACTIVE

## 2023-09-03 LAB — TSH: TSH: 2.71 u[IU]/mL (ref 0.450–4.500)

## 2023-10-21 ENCOUNTER — Encounter: Payer: Self-pay | Admitting: Emergency Medicine

## 2023-10-21 ENCOUNTER — Other Ambulatory Visit: Payer: Self-pay

## 2023-10-21 ENCOUNTER — Ambulatory Visit
Admission: EM | Admit: 2023-10-21 | Discharge: 2023-10-21 | Disposition: A | Discharge status: Home / Self Care | Attending: Family Medicine | Admitting: Family Medicine

## 2023-10-21 DIAGNOSIS — J029 Acute pharyngitis, unspecified: Secondary | ICD-10-CM | POA: Insufficient documentation

## 2023-10-21 DIAGNOSIS — H65193 Other acute nonsuppurative otitis media, bilateral: Secondary | ICD-10-CM | POA: Diagnosis not present

## 2023-10-21 LAB — POC SARS CORONAVIRUS 2 AG -  ED: SARS Coronavirus 2 Ag: NEGATIVE

## 2023-10-21 LAB — POCT RAPID STREP A (OFFICE): Rapid Strep A Screen: NEGATIVE

## 2023-10-21 MED ORDER — DEXAMETHASONE SODIUM PHOSPHATE 10 MG/ML IJ SOLN
10.0000 mg | Freq: Once | INTRAMUSCULAR | Status: AC
  Administered 2023-10-21: 10 mg via INTRAMUSCULAR

## 2023-10-21 NOTE — Discharge Instructions (Addendum)
 You most likely have a viral sore throat.  Strep throat and COVID-19 test are negative.  Symptoms should improve over the next few days.  If you develop chest pain or shortness of breath, go to the emergency room.  You also have a little bit of fluid behind your ears causing the ear pain.  We have given you an injection of a steroid medicine today to help with the fluid as well as help with your swollen tonsils.  Some things that can make you feel better are: - Increased rest - Increasing fluid with water/sugar free electrolytes - Acetaminophen  and ibuprofen as needed for fever/pain - Salt water gargling, chloraseptic spray and throat lozenges for sore throat

## 2023-10-21 NOTE — ED Triage Notes (Signed)
 Pt reports sore throat, fever, headache for last several days. Reports fever broke yesterday. Reports bilateral ear pain started this am.

## 2023-10-21 NOTE — ED Provider Notes (Addendum)
 [facility]    CSN: [id number] Arrival date & time: 10/21/23  0844      History   Chief Complaint Chief Complaint  Patient presents with   Sore Throat    HPI Yvette Phelps is a 28 y.o. female.   Patient presents today with 2-day history of sore throat, fever, headache, and bilateral ear pain.  Tmax 102.  No fever since yesterday.  She denies cough, runny or stuffy nose, abdominal pain, nausea/vomiting, diarrhea, and significantly decreased appetite.  No known sick contacts.  Has been taking ibuprofen which did help with the fever.  Has also been eating lots of ice chips which help with the sore throat.  Over-the-counter Chloraseptic cough drops also help with the sore throat.  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    History reviewed. No pertinent past medical history.  Patient Active Problem List   Diagnosis Date Noted   Chronic migraine without aura, with intractable migraine, so stated, with status migrainosus 09/18/2017   Morbid obesity (HCC) 09/18/2017    Past Surgical History:  Procedure Laterality Date   UMBILICAL HERNIA REPAIR     Done around age 34    OB History     Gravida  0   Para  0   Term  0   Preterm  0   AB  0   Living  0      SAB  0   IAB  0   Ectopic  0   Multiple  0   Live Births  0            Home Medications    Prior to Admission medications   Medication Sig Start Date End Date Taking? Authorizing Provider  drospirenone -ethinyl estradiol  (YAZ) 3-0.02 MG tablet Take 1 tablet by mouth daily. 09/01/23   Delores Nidia CROME, FNP    Family History Family History  Problem Relation Age of Onset   Hypertension Mother    Hypertension Father    Heart disease Maternal Grandmother    Hypertension Maternal Grandmother    Stroke Maternal Grandmother    Hypertension Maternal Grandfather    Hypertension Paternal Grandmother    Cancer Paternal Grandfather     Diabetes Paternal Grandfather    Kidney disease Paternal Grandfather    Hypertension Paternal Grandfather     Social History Social History   Tobacco Use   Smoking status: Never   Smokeless tobacco: Never  Vaping Use   Vaping status: Every Day  Substance Use Topics   Alcohol use: Yes    Comment: social drinker   Drug use: No     Allergies   Patient has no known allergies.   Review of Systems Review of Systems Per HPI  Physical Exam Triage Vital Signs ED Triage Vitals  Encounter Vitals Group     BP 10/21/23 0936 (!) 172/78     Girls Systolic BP Percentile --      Girls Diastolic BP Percentile --      Boys Systolic BP Percentile --      Boys Diastolic BP Percentile --      Pulse Rate 10/21/23 0936 89     Resp 10/21/23 0936 20     Temp 10/21/23 0936 98.3 F (36.8 C)     Temp Source 10/21/23 0936 Oral     SpO2 10/21/23 0936 96 %     Weight --      Height --  Head Circumference --      Peak Flow --      Pain Score 10/21/23 0935 9     Pain Loc --      Pain Education --      Exclude from Growth Chart --    No data found.  Updated Vital Signs BP 127/85 (BP Location: Right Arm)   Pulse 89   Temp 98.3 F (36.8 C) (Oral)   Resp 20   LMP 10/08/2023 (Approximate)   SpO2 96%   Visual Acuity Right Eye Distance:   Left Eye Distance:   Bilateral Distance:    Right Eye Near:   Left Eye Near:    Bilateral Near:     Physical Exam Vitals and nursing note reviewed.  Constitutional:      General: She is not in acute distress.    Appearance: She is well-developed. She is not toxic-appearing.  HENT:     Head: Normocephalic and atraumatic.     Right Ear: Ear canal normal. No drainage, swelling or tenderness. A middle ear effusion is present. Tympanic membrane is not erythematous.     Left Ear: Ear canal normal. No drainage, swelling or tenderness. A middle ear effusion is present. Tympanic membrane is not erythematous.     Nose: No congestion or  rhinorrhea.     Mouth/Throat:     Mouth: Mucous membranes are moist.     Pharynx: Oropharynx is clear. Uvula midline. Posterior oropharyngeal erythema present. No oropharyngeal exudate.     Tonsils: No tonsillar exudate. 2+ on the right. 2+ on the left.     Comments: Petechiae to posterior pharynx  Eyes:     Extraocular Movements:     Right eye: Normal extraocular motion.     Left eye: Normal extraocular motion.    Cardiovascular:     Rate and Rhythm: Normal rate and regular rhythm.  Pulmonary:     Effort: Pulmonary effort is normal. No respiratory distress.     Breath sounds: Normal breath sounds. No wheezing, rhonchi or rales.   Musculoskeletal:     Cervical back: Normal range of motion and neck supple.  Lymphadenopathy:     Cervical: No cervical adenopathy.   Skin:    General: Skin is warm and dry.     Capillary Refill: Capillary refill takes less than 2 seconds.     Coloration: Skin is not pale.     Findings: No erythema or rash.   Neurological:     Mental Status: She is alert and oriented to person, place, and time.   Psychiatric:        Behavior: Behavior is cooperative.      UC Treatments / Results  Labs (all labs ordered are listed, but only abnormal results are displayed) Labs Reviewed  CULTURE, GROUP A STREP [facility])  POCT RAPID STREP A (OFFICE)  POC SARS CORONAVIRUS 2 AG -  ED    EKG   Radiology No results found.  Procedures Procedures (including critical care time)  Medications Ordered in UC Medications  dexamethasone (DECADRON) injection 10 mg (10 mg Intramuscular Given 10/21/23 1039)    Initial Impression / Assessment and Plan / UC Course  I have reviewed the triage vital signs and the nursing notes.  Pertinent labs & imaging results that were available during my care of the patient were reviewed by me and considered in my medical decision making (see chart for details).   Patient is well-appearing, normotensive, afebrile, not tachycardic,  not tachypneic, oxygenating  well on room air.   1. Acute pharyngitis, unspecified etiology 2. Acute MEE (middle ear effusion), bilateral Rapid strep and covid-19 test negative Throat culture pending Suspect likely viral etiology Given tonsillitis with bilateral ear effusions, treated with Decadron 10 mg in urgent care today Supportive care discussed ER and return precautions discussed Work excuse provided   The patient was given the opportunity to ask questions.  All questions answered to their satisfaction.  The patient is in agreement to this plan.   Final Clinical Impressions(s) / UC Diagnoses   Final diagnoses:  Acute pharyngitis, unspecified etiology  Acute MEE (middle ear effusion), bilateral     Discharge Instructions      You most likely have a viral sore throat.  Strep throat and COVID-19 test are negative.  Symptoms should improve over the next few days.  If you develop chest pain or shortness of breath, go to the emergency room.  You also have a little bit of fluid behind your ears causing the ear pain.  We have given you an injection of a steroid medicine today to help with the fluid as well as help with your swollen tonsils.  Some things that can make you feel better are: - Increased rest - Increasing fluid with water/sugar free electrolytes - Acetaminophen  and ibuprofen as needed for fever/pain - Salt water gargling, chloraseptic spray and throat lozenges for sore throat     ED Prescriptions   None    PDMP not reviewed this encounter.   Chandra Harlene LABOR, NP 10/21/23 1420    Chandra Harlene LABOR, NP 10/21/23 1421

## 2023-10-22 ENCOUNTER — Telehealth: Payer: Self-pay | Admitting: Emergency Medicine

## 2023-10-22 NOTE — Telephone Encounter (Signed)
 Pt called and inquired about pending strep culture and time frame related to culture resulting. Discussed time frame, reviewed work note, and fever management. Pt verbalized understanding.

## 2023-10-24 LAB — CULTURE, GROUP A STREP (THRC)

## 2023-11-25 ENCOUNTER — Encounter: Payer: Self-pay | Admitting: Obstetrics & Gynecology

## 2023-12-08 ENCOUNTER — Encounter: Payer: Self-pay | Admitting: Adult Health

## 2023-12-08 ENCOUNTER — Ambulatory Visit: Admitting: Adult Health

## 2023-12-08 VITALS — BP 121/81 | HR 88 | Ht 64.0 in | Wt 370.0 lb

## 2023-12-08 DIAGNOSIS — N921 Excessive and frequent menstruation with irregular cycle: Secondary | ICD-10-CM | POA: Diagnosis not present

## 2023-12-08 DIAGNOSIS — R030 Elevated blood-pressure reading, without diagnosis of hypertension: Secondary | ICD-10-CM

## 2023-12-08 DIAGNOSIS — Z3041 Encounter for surveillance of contraceptive pills: Secondary | ICD-10-CM | POA: Diagnosis not present

## 2023-12-08 MED ORDER — LO LOESTRIN FE 1 MG-10 MCG / 10 MCG PO TABS: 1.0000 | ORAL_TABLET | Freq: Every day | ORAL | Status: DC

## 2023-12-08 NOTE — Progress Notes (Signed)
  Subjective:     Patient ID: Yvette Phelps, female   DOB: 1996/03/25, 28 y.o.   MRN: [id number]  HPI [name] is a 28 year old white female,single, G0P0, in complaining of spotting/bleeding since 11/04/23 with Yaz, has spotting first 2 months too.     Component Value Date/Time   DIAGPAP  09/01/2023 0905    - Negative for intraepithelial lesion or malignancy (NILM)   HPVHIGH Negative 09/01/2023 0905   ADEQPAP  09/01/2023 0905    Satisfactory for evaluation; transformation zone component PRESENT.    PCP is Dr Tonita  Review of Systems +spotting/bleeding since 11/04/23 with Yaz, has spotting first 2 months too.   Denies any pain or pain with sex Denies MI,stroke, DVT, breast cancer or migraine with aura Reviewed past medical,surgical, social and family history. Reviewed medications and allergies.  Objective:   Physical Exam BP 121/81 (BP Location: Right Arm, Patient Position: Sitting, Cuff Size: Normal)   Pulse 88   Ht 5' 4 (1.626 m)   Wt (!) 370 lb (167.8 kg)   LMP 11/04/2023 (Exact Date)   BMI 63.51 kg/m     Skin warm and dry.  Lungs: clear to ausculation bilaterally. Cardiovascular: regular rate and rhythm. Fall risk I slow  Upstream - 12/08/23 0911       Pregnancy Intention Screening   Does the patient want to become pregnant in the next year? No    Does the patient's partner want to become pregnant in the next year? No    Would the patient like to discuss contraceptive options today? Yes      Contraception Wrap Up   Current Method Oral Contraceptive    End Method Oral Contraceptive    Contraception Counseling Provided Yes    How was the end contraceptive method provided? Provided on site           Assessment:     1. Irregular intermenstrual bleeding (Primary) +spotting/bleeding since 11/04/23 with Yaz, has spotting first 2 months too. Stop Yaz and start lo Loestrin , can start tonight, use condoms for 1 pack  2. Encounter for surveillance of contraceptive  pills Gave 3 packs of lo Loestrin  to start  Meds ordered this encounter  Medications   Norethindrone -Ethinyl Estradiol -Fe Biphas (LO LOESTRIN FE ) 1 MG-10 MCG / 10 MCG tablet    Sig: Take 1 tablet by mouth daily. Take 1 daily by mouth    BIN [id number], PCN [id number], GRP [id number],PI [id number]    Supervising Provider:   JAYNE MINDER H [[id number]]    3. White coat syndrome without diagnosis of hypertension She says BP goes up at doctor's office      Plan:     Follow up in 3 months for ROS

## 2024-01-06 ENCOUNTER — Ambulatory Visit (INDEPENDENT_AMBULATORY_CARE_PROVIDER_SITE_OTHER): Admitting: Adult Health

## 2024-01-06 ENCOUNTER — Encounter: Payer: Self-pay | Admitting: Adult Health

## 2024-01-06 VITALS — BP 129/84 | HR 92 | Ht 64.0 in | Wt 369.8 lb

## 2024-01-06 DIAGNOSIS — N6322 Unspecified lump in the left breast, upper inner quadrant: Secondary | ICD-10-CM | POA: Insufficient documentation

## 2024-01-06 NOTE — Progress Notes (Signed)
  Subjective:     Patient ID: Yvette Phelps, female   DOB: Sep 15, 1995, 28 y.o.   MRN: [id number]  HPI Yvette Phelps is a 28 year old white female, single, G0P0, in complaining of left breast mass, noticed Sunday, no know injury.     Component Value Date/Time   DIAGPAP  09/01/2023 0905    - Negative for intraepithelial lesion or malignancy (NILM)   HPVHIGH Negative 09/01/2023 0905   ADEQPAP  09/01/2023 0905    Satisfactory for evaluation; transformation zone component PRESENT.    PCP is Yvette Phelps Review of Systems +left breast mass, noticed Sunday, no known injury Reviewed past medical,surgical, social and family history. Reviewed medications and allergies.     Objective:   Physical Exam BP 129/84 (BP Location: Left Arm, Patient Position: Sitting, Cuff Size: Large)   Pulse 92   Ht 5' 4 (1.626 m)   Wt (!) 369 lb 12.8 oz (167.7 kg)   LMP 01/04/2024 (Exact Date)   BMI 63.48 kg/m      Skin warm and dry,  Breasts:no dominate palpable mass, retraction or nipple discharge on the right, on the left, no retraction or nipple discharge, has round, mobile mass at 11 0' clock, about 4 FB from areola, that is tender if palpated, and has BB mass at about 12 0'clock about 5-6 FB from areola.   Upstream - 01/06/24 1133       Pregnancy Intention Screening   Does the patient want to become pregnant in the next year? No    Does the patient's partner want to become pregnant in the next year? No    Would the patient like to discuss contraceptive options today? No      Contraception Wrap Up   Current Method Oral Contraceptive    End Method Oral Contraceptive    Contraception Counseling Provided Yes          Assessment:     1. Mass of upper inner quadrant of left breast (Primary)  +has round, mobile mass at 11 0' clock, about 4 FB from areola, that is tender if palpated, and has BB mass at about 12 0'clock about 5-6 FB from areola. Left breast US  scheduled at [facility] for 01/20/24 at 3:50  pm to assess, will message when results back  - US  LIMITED ULTRASOUND INCLUDING AXILLA LEFT BREAST ; Future     Plan:     Follow up prn

## 2024-01-20 ENCOUNTER — Ambulatory Visit (HOSPITAL_COMMUNITY)
Admission: RE | Admit: 2024-01-20 | Discharge: 2024-01-20 | Disposition: A | Discharge status: Home / Self Care | Source: Ambulatory Visit | Attending: Adult Health | Admitting: Adult Health

## 2024-01-20 DIAGNOSIS — N6322 Unspecified lump in the left breast, upper inner quadrant: Secondary | ICD-10-CM | POA: Diagnosis not present

## 2024-01-21 ENCOUNTER — Ambulatory Visit: Payer: Self-pay | Admitting: Adult Health

## 2024-01-28 ENCOUNTER — Ambulatory Visit
Admission: RE | Admit: 2024-01-28 | Discharge: 2024-01-28 | Disposition: A | Discharge status: Home / Self Care | Source: Ambulatory Visit | Attending: Nurse Practitioner | Admitting: Nurse Practitioner

## 2024-01-28 VITALS — BP 134/81 | HR 71 | Temp 98.9°F | Resp 20

## 2024-01-28 DIAGNOSIS — J069 Acute upper respiratory infection, unspecified: Secondary | ICD-10-CM | POA: Diagnosis not present

## 2024-01-28 LAB — POC SOFIA SARS ANTIGEN FIA: SARS Coronavirus 2 Ag: NEGATIVE

## 2024-01-28 LAB — POCT RAPID STREP A (OFFICE): Rapid Strep A Screen: NEGATIVE

## 2024-01-28 MED ORDER — PSEUDOEPH-BROMPHEN-DM 30-2-10 MG/5ML PO SYRP: 5.0000 mL | ORAL_SOLUTION | Freq: Four times a day (QID) | ORAL | 0 refills | Status: DC | PRN

## 2024-01-28 MED ORDER — CETIRIZINE HCL 10 MG PO TABS: 10.0000 mg | ORAL_TABLET | Freq: Every day | ORAL | 0 refills | Status: AC

## 2024-01-28 MED ORDER — FLUTICASONE PROPIONATE 50 MCG/ACT NA SUSP: 2.0000 | Freq: Every day | NASAL | 0 refills | Status: AC

## 2024-01-28 NOTE — ED Triage Notes (Signed)
 Pt reports sore throat, pain in both ears, and cough x 1 week. Pt has been using ibuprofen.

## 2024-01-28 NOTE — Discharge Instructions (Addendum)
 COVID/flu and rapid strep test are negative. Take medication as prescribed. Increase fluids and allow for plenty of rest. You may continue over-the-counter Tylenol  or ibuprofen as needed for pain, fever, or general discomfort. Recommend warm salt water gargles 3-4 times daily as needed for throat pain or discomfort. Symptoms should begin to improve within the next 5 to 7 days.  If symptoms fail to improve, or begin to worsen, you may follow-up in this clinic or with your primary care physician for further evaluation. Follow-up as needed.

## 2024-01-28 NOTE — ED Provider Notes (Signed)
 [facility]    CSN: [id number] Arrival date & time: 01/28/24  1647      History   Chief Complaint Chief Complaint  Patient presents with   Sore Throat    Sore throat, can barely swallow. Pain in both ears - Entered by patient    HPI Yvette Phelps is a 28 y.o. female.   The history is provided by the patient.   Patient presents with a 1 week history of sore throat, bilateral ear pain, and cough.  She denies fever, chills, ear drainage, wheezing, difficulty breathing, chest pain, abdominal pain, nausea, vomiting, or diarrhea.  Patient states she has been taking ibuprofen for her symptoms.  States her throat pain is worse at night and in the mornings.  She denies any obvious close sick contacts.  History reviewed. No pertinent past medical history.  Patient Active Problem List   Diagnosis Date Noted   Mass of upper inner quadrant of left breast 01/06/2024   Encounter for surveillance of contraceptive pills 12/08/2023   Irregular intermenstrual bleeding 12/08/2023   White coat syndrome without diagnosis of hypertension 12/08/2023   Chronic migraine without aura, with intractable migraine, so stated, with status migrainosus 09/18/2017   Morbid obesity (HCC) 09/18/2017    Past Surgical History:  Procedure Laterality Date   UMBILICAL HERNIA REPAIR     Done around age 66    OB History     Gravida  0   Para  0   Term  0   Preterm  0   AB  0   Living  0      SAB  0   IAB  0   Ectopic  0   Multiple  0   Live Births  0            Home Medications    Prior to Admission medications   Medication Sig Start Date End Date Taking? Authorizing Provider  brompheniramine-pseudoephedrine-DM 30-2-10 MG/5ML syrup Take 5 mLs by mouth 4 (four) times daily as needed. 01/28/24  Yes Leath-Warren, Etta PARAS, NP  cetirizine (ZYRTEC) 10 MG tablet Take 1 tablet (10 mg total) by mouth daily. 01/28/24  Yes Leath-Warren, Etta PARAS, NP  fluticasone (FLONASE)  50 MCG/ACT nasal spray Place 2 sprays into both nostrils daily. 01/28/24  Yes Leath-Warren, Etta PARAS, NP  Norethindrone -Ethinyl Estradiol -Fe Biphas (LO LOESTRIN FE ) 1 MG-10 MCG / 10 MCG tablet Take 1 tablet by mouth daily. Take 1 daily by mouth 12/08/23   Signa Delon LABOR, NP    Family History Family History  Problem Relation Age of Onset   Hypertension Mother    Hypertension Father    Heart disease Maternal Grandmother    Hypertension Maternal Grandmother    Stroke Maternal Grandmother    Hypertension Maternal Grandfather    Hypertension Paternal Grandmother    Cancer Paternal Grandfather    Diabetes Paternal Grandfather    Kidney disease Paternal Grandfather    Hypertension Paternal Grandfather     Social History Social History   Tobacco Use   Smoking status: Never   Smokeless tobacco: Never  Vaping Use   Vaping status: Every Day  Substance Use Topics   Alcohol use: Yes    Comment: social drinker   Drug use: No     Allergies   Patient has no known allergies.   Review of Systems Review of Systems Per HPI  Physical Exam Triage Vital Signs ED Triage Vitals  Encounter Vitals Group  BP 01/28/24 1655 134/81     Girls Systolic BP Percentile --      Girls Diastolic BP Percentile --      Boys Systolic BP Percentile --      Boys Diastolic BP Percentile --      Pulse Rate 01/28/24 1655 71     Resp 01/28/24 1655 20     Temp 01/28/24 1655 98.9 F (37.2 C)     Temp Source 01/28/24 1655 Oral     SpO2 01/28/24 1655 96 %     Weight --      Height --      Head Circumference --      Peak Flow --      Pain Score 01/28/24 1657 4     Pain Loc --      Pain Education --      Exclude from Growth Chart --    No data found.  Updated Vital Signs BP 134/81 (BP Location: Right Arm)   Pulse 71   Temp 98.9 F (37.2 C) (Oral)   Resp 20   LMP 01/04/2024 (Exact Date)   SpO2 96%   Visual Acuity Right Eye Distance:   Left Eye Distance:   Bilateral Distance:     Right Eye Near:   Left Eye Near:    Bilateral Near:     Physical Exam Vitals and nursing note reviewed.  Constitutional:      General: She is not in acute distress.    Appearance: Normal appearance. She is well-developed.  HENT:     Head: Normocephalic and atraumatic.     Right Ear: Tympanic membrane, ear canal and external ear normal.     Left Ear: Tympanic membrane, ear canal and external ear normal.     Nose: Congestion present.     Right Turbinates: Enlarged and swollen.     Left Turbinates: Enlarged and swollen.     Right Sinus: No maxillary sinus tenderness or frontal sinus tenderness.     Left Sinus: No maxillary sinus tenderness or frontal sinus tenderness.     Mouth/Throat:     Lips: Pink.     Mouth: Mucous membranes are moist.     Pharynx: Uvula midline. Posterior oropharyngeal erythema and postnasal drip present. No pharyngeal swelling, oropharyngeal exudate or uvula swelling.     Comments: Cobblestoning present to posterior oropharynx  Eyes:     Extraocular Movements: Extraocular movements intact.     Conjunctiva/sclera: Conjunctivae normal.     Pupils: Pupils are equal, round, and reactive to light.  Neck:     Thyroid: No thyromegaly.     Trachea: No tracheal deviation.  Cardiovascular:     Rate and Rhythm: Normal rate and regular rhythm.     Pulses: Normal pulses.     Heart sounds: Normal heart sounds.  Pulmonary:     Effort: Pulmonary effort is normal. No respiratory distress.     Breath sounds: Normal breath sounds. No stridor. No wheezing, rhonchi or rales.  Abdominal:     General: Bowel sounds are normal.     Palpations: Abdomen is soft.     Tenderness: There is no abdominal tenderness.  Musculoskeletal:     Cervical back: Normal range of motion and neck supple.  Skin:    General: Skin is warm and dry.  Neurological:     General: No focal deficit present.     Mental Status: She is alert and oriented to person, place, and time.  Psychiatric:  Mood and Affect: Mood normal.        Behavior: Behavior normal.        Thought Content: Thought content normal.        Judgment: Judgment normal.      UC Treatments / Results  Labs (all labs ordered are listed, but only abnormal results are displayed) Labs Reviewed  POCT RAPID STREP A (OFFICE)  POC SOFIA SARS ANTIGEN FIA    EKG   Radiology No results found.  Procedures Procedures (including critical care time)  Medications Ordered in UC Medications - No data to display  Initial Impression / Assessment and Plan / UC Course  I have reviewed the triage vital signs and the nursing notes.  Pertinent labs & imaging results that were available during my care of the patient were reviewed by me and considered in my medical decision making (see chart for details).  Patient presents with a 1 week history of sore throat, bilateral ear pain and cough.  On exam, lung sounds are clear throughout, room air sats at 96%.  Rapid strep test and COVID test were negative.  Symptoms consistent with a viral URI with cough.'s symptomatic treatment provided with Bromfed-DM for the cough, fluticasone 50 mcg nasal spray, and cetirizine 10 mg.  Supportive care recommendations were provided and discussed with the patient to include fluids, rest, over-the-counter analgesics, warm salt water gargles, and use of normal saline nasal spray.  Discussed indications with patient regarding follow-up.  Patient is in agreement with this plan of care and verbalizes understanding.  All questions were answered.  Patient stable for discharge.  Work note was provided.  Final Clinical Impressions(s) / UC Diagnoses   Final diagnoses:  Viral URI with cough     Discharge Instructions      COVID/flu and rapid strep test are negative. Take medication as prescribed. Increase fluids and allow for plenty of rest. You may continue over-the-counter Tylenol  or ibuprofen as needed for pain, fever, or general  discomfort. Recommend warm salt water gargles 3-4 times daily as needed for throat pain or discomfort. Symptoms should begin to improve within the next 5 to 7 days.  If symptoms fail to improve, or begin to worsen, you may follow-up in this clinic or with your primary care physician for further evaluation. Follow-up as needed.     ED Prescriptions     Medication Sig Dispense Auth. Provider   brompheniramine-pseudoephedrine-DM 30-2-10 MG/5ML syrup Take 5 mLs by mouth 4 (four) times daily as needed. 140 mL Leath-Warren, Etta PARAS, NP   cetirizine (ZYRTEC) 10 MG tablet Take 1 tablet (10 mg total) by mouth daily. 30 tablet Leath-Warren, Etta PARAS, NP   fluticasone (FLONASE) 50 MCG/ACT nasal spray Place 2 sprays into both nostrils daily. 16 g Leath-Warren, Etta PARAS, NP      PDMP not reviewed this encounter.   Gilmer Etta PARAS, NP 01/28/24 1820

## 2024-02-18 ENCOUNTER — Ambulatory Visit
Admission: RE | Admit: 2024-02-18 | Discharge: 2024-02-18 | Disposition: A | Discharge status: Home / Self Care | Attending: Family Medicine | Admitting: Family Medicine

## 2024-02-18 VITALS — BP 162/91 | HR 79 | Temp 98.3°F

## 2024-02-18 DIAGNOSIS — J209 Acute bronchitis, unspecified: Secondary | ICD-10-CM

## 2024-02-18 DIAGNOSIS — J3089 Other allergic rhinitis: Secondary | ICD-10-CM | POA: Diagnosis not present

## 2024-02-18 MED ORDER — AZELASTINE HCL 0.1 % NA SOLN: 1.0000 | Freq: Two times a day (BID) | NASAL | 0 refills | Status: AC

## 2024-02-18 MED ORDER — ALBUTEROL SULFATE HFA 108 (90 BASE) MCG/ACT IN AERS: 2.0000 | INHALATION_SPRAY | RESPIRATORY_TRACT | 0 refills | Status: AC | PRN

## 2024-02-18 MED ORDER — DEXAMETHASONE SOD PHOSPHATE PF 10 MG/ML IJ SOLN
10.0000 mg | Freq: Once | INTRAMUSCULAR | Status: AC
  Administered 2024-02-18: 10 mg via INTRAMUSCULAR

## 2024-02-18 NOTE — ED Provider Notes (Signed)
 [facility]    CSN: [id number] Arrival date & time: 02/18/24  1823      History   Chief Complaint Chief Complaint  Patient presents with   Cough    There a few weeks ago and haven't gotten better. deep cough, pain in my ears, and sinus pressure. It gets bad at night, a little better through the day, then bad again at night and when I wake up. I was given allergy  meds and they haven't helped - Entered by patient    HPI Yvette Phelps is a 28 y.o. female.   Patient presenting today with 3-week history of ongoing congestion, hacking cough, wheezing, ear pressure.  Denies fever, chills, chest pain, shortness of breath, abdominal pain, vomiting, diarrhea.  Was seen on 01/28/2024 and treated for viral respiratory infection and has been taking antihistamines, nasal sprays with minimal relief.  No known history of chronic pulmonary disease.    History reviewed. No pertinent past medical history.  Patient Active Problem List   Diagnosis Date Noted   Mass of upper inner quadrant of left breast 01/06/2024   Encounter for surveillance of contraceptive pills 12/08/2023   Irregular intermenstrual bleeding 12/08/2023   White coat syndrome without diagnosis of hypertension 12/08/2023   Chronic migraine without aura, with intractable migraine, so stated, with status migrainosus 09/18/2017   Morbid obesity (HCC) 09/18/2017    Past Surgical History:  Procedure Laterality Date   UMBILICAL HERNIA REPAIR     Done around age 6    OB History     Gravida  0   Para  0   Term  0   Preterm  0   AB  0   Living  0      SAB  0   IAB  0   Ectopic  0   Multiple  0   Live Births  0            Home Medications    Prior to Admission medications   Medication Sig Start Date End Date Taking? Authorizing Provider  albuterol (VENTOLIN HFA) 108 (90 Base) MCG/ACT inhaler Inhale 2 puffs into the lungs every 4 (four) hours as needed. 02/18/24  Yes Stuart Vernell Norris, PA-C  azelastine (ASTELIN) 0.1 % nasal spray Place 1 spray into both nostrils 2 (two) times daily. Use in each nostril as directed 02/18/24  Yes Stuart Vernell Norris, PA-C  brompheniramine-pseudoephedrine-DM 30-2-10 MG/5ML syrup Take 5 mLs by mouth 4 (four) times daily as needed. 01/28/24   Leath-Warren, Etta PARAS, NP  cetirizine (ZYRTEC) 10 MG tablet Take 1 tablet (10 mg total) by mouth daily. 01/28/24   Leath-Warren, Etta PARAS, NP  fluticasone (FLONASE) 50 MCG/ACT nasal spray Place 2 sprays into both nostrils daily. 01/28/24   Leath-Warren, Etta PARAS, NP  Norethindrone -Ethinyl Estradiol -Fe Biphas (LO LOESTRIN FE ) 1 MG-10 MCG / 10 MCG tablet Take 1 tablet by mouth daily. Take 1 daily by mouth 12/08/23   Signa Delon LABOR, NP    Family History Family History  Problem Relation Age of Onset   Hypertension Mother    Hypertension Father    Heart disease Maternal Grandmother    Hypertension Maternal Grandmother    Stroke Maternal Grandmother    Hypertension Maternal Grandfather    Hypertension Paternal Grandmother    Cancer Paternal Grandfather    Diabetes Paternal Grandfather    Kidney disease Paternal Grandfather    Hypertension Paternal Grandfather     Social History Social History  Tobacco Use   Smoking status: Never   Smokeless tobacco: Never  Vaping Use   Vaping status: Every Day  Substance Use Topics   Alcohol use: Yes    Comment: social drinker   Drug use: No     Allergies   Patient has no known allergies.   Review of Systems Review of Systems Per HPI  Physical Exam Triage Vital Signs ED Triage Vitals  Encounter Vitals Group     BP 02/18/24 1827 (!) 162/91     Girls Systolic BP Percentile --      Girls Diastolic BP Percentile --      Boys Systolic BP Percentile --      Boys Diastolic BP Percentile --      Pulse Rate 02/18/24 1827 79     Resp --      Temp 02/18/24 1827 98.3 F (36.8 C)     Temp Source 02/18/24 1827 Oral     SpO2 02/18/24  1827 95 %     Weight --      Height --      Head Circumference --      Peak Flow --      Pain Score 02/18/24 1828 6     Pain Loc --      Pain Education --      Exclude from Growth Chart --    No data found.  Updated Vital Signs BP (!) 162/91 (BP Location: Right Arm)   Pulse 79   Temp 98.3 F (36.8 C) (Oral)   SpO2 95%   Visual Acuity Right Eye Distance:   Left Eye Distance:   Bilateral Distance:    Right Eye Near:   Left Eye Near:    Bilateral Near:     Physical Exam Vitals and nursing note reviewed.  Constitutional:      Appearance: Normal appearance.  HENT:     Head: Atraumatic.     Right Ear: Tympanic membrane and external ear normal.     Left Ear: Tympanic membrane and external ear normal.     Nose: Rhinorrhea present.     Mouth/Throat:     Mouth: Mucous membranes are moist.     Pharynx: Posterior oropharyngeal erythema present.  Eyes:     Extraocular Movements: Extraocular movements intact.     Conjunctiva/sclera: Conjunctivae normal.  Cardiovascular:     Rate and Rhythm: Normal rate and regular rhythm.     Heart sounds: Normal heart sounds.  Pulmonary:     Effort: Pulmonary effort is normal.     Breath sounds: Normal breath sounds. No wheezing or rales.  Musculoskeletal:        General: Normal range of motion.     Cervical back: Normal range of motion and neck supple.  Skin:    General: Skin is warm and dry.  Neurological:     Mental Status: She is alert and oriented to person, place, and time.  Psychiatric:        Mood and Affect: Mood normal.        Thought Content: Thought content normal.      UC Treatments / Results  Labs (all labs ordered are listed, but only abnormal results are displayed) Labs Reviewed - No data to display  EKG   Radiology No results found.  Procedures Procedures (including critical care time)  Medications Ordered in UC Medications  dexamethasone  (DECADRON ) injection 10 mg (10 mg Intramuscular Given 02/18/24  1858)    Initial Impression / Assessment  and Plan / UC Course  I have reviewed the triage vital signs and the nursing notes.  Pertinent labs & imaging results that were available during my care of the patient were reviewed by me and considered in my medical decision making (see chart for details).     Vital signs and exam overall reassuring, mildly hypertensive but otherwise within normal limits.  Suspect seasonal allergy  exacerbation causing bronchitis.  Treat with albuterol, Astelin, IM Decadron , continued antihistamines and nasal sprays.  Supportive over-the-counter medications, home care reviewed.  Return for worsening symptoms.  Final Clinical Impressions(s) / UC Diagnoses   Final diagnoses:  Seasonal allergic rhinitis due to other allergic trigger  Acute bronchitis, unspecified organism   Discharge Instructions   None    ED Prescriptions     Medication Sig Dispense Auth. Provider   albuterol (VENTOLIN HFA) 108 (90 Base) MCG/ACT inhaler Inhale 2 puffs into the lungs every 4 (four) hours as needed. 18 g Stuart Vernell Norris, PA-C   azelastine (ASTELIN) 0.1 % nasal spray Place 1 spray into both nostrils 2 (two) times daily. Use in each nostril as directed 30 mL Stuart Vernell Norris, PA-C      PDMP not reviewed this encounter.   Stuart Vernell Norris, [place] 02/18/24 1920

## 2024-02-18 NOTE — ED Triage Notes (Addendum)
 Pt reports sore throat pain in the ears, cough and congestion pt was seen on 01/28/2024 and treated for URI, sx's have not gotten any better. Pt states cough is now productive sh coughs up green colored mucus

## 2024-02-26 ENCOUNTER — Other Ambulatory Visit: Payer: Self-pay | Admitting: *Deleted

## 2024-02-26 MED ORDER — NORETHIN ACE-ETH ESTRAD-FE 1-20 MG-MCG(24) PO TABS: 1.0000 | ORAL_TABLET | Freq: Every day | ORAL | 0 refills | Status: DC

## 2024-03-07 ENCOUNTER — Ambulatory Visit (HOSPITAL_COMMUNITY)

## 2024-03-07 DIAGNOSIS — J038 Acute tonsillitis due to other specified organisms: Secondary | ICD-10-CM | POA: Diagnosis not present

## 2024-03-07 DIAGNOSIS — B9689 Other specified bacterial agents as the cause of diseases classified elsewhere: Secondary | ICD-10-CM | POA: Diagnosis not present

## 2024-03-08 ENCOUNTER — Ambulatory Visit: Payer: Self-pay

## 2024-03-09 ENCOUNTER — Encounter: Payer: Self-pay | Admitting: Adult Health

## 2024-03-09 ENCOUNTER — Ambulatory Visit: Admitting: Adult Health

## 2024-03-09 VITALS — BP 133/83 | HR 101 | Ht 64.0 in | Wt 370.6 lb

## 2024-03-09 DIAGNOSIS — Z3041 Encounter for surveillance of contraceptive pills: Secondary | ICD-10-CM | POA: Diagnosis not present

## 2024-03-09 MED ORDER — NORETHIN ACE-ETH ESTRAD-FE 1-20 MG-MCG(24) PO TABS: 1.0000 | ORAL_TABLET | Freq: Every day | ORAL | 3 refills | Status: AC

## 2024-03-09 NOTE — Progress Notes (Signed)
  Subjective:     Patient ID: Yvette Phelps, female   DOB: 04-26-1996, 28 y.o.   MRN: [id number]  HPI [name] is a 28 year old white female, single,G0P0, in for follow up on changing BCP and periods are shorter,lasting about 4 days , but days 2 and 3 still heavy, may change tampon every 1.5 to 2 hours. But still better than before,    Component Value Date/Time   DIAGPAP  09/01/2023 0905    - Negative for intraepithelial lesion or malignancy (NILM)   HPVHIGH Negative 09/01/2023 0905   ADEQPAP  09/01/2023 0905    Satisfactory for evaluation; transformation zone component PRESENT.   PCP is Dr Tonita  Review of Systems Periods shorter, lasting about 4 days Days 2 and 3 heavy, may change tampon every 1.5 to 2 hours Reviewed past medical,surgical, social and family history. Reviewed medications and allergies.     Objective:   Physical Exam BP 133/83 (BP Location: Right Arm, Patient Position: Sitting, Cuff Size: Large) Comment (BP Location): lower arm  Pulse (!) 101   Ht 5' 4 (1.626 m)   Wt (!) 370 lb 9.6 oz (168.1 kg)   LMP 02/28/2024   BMI 63.61 kg/m  Skin warm and dry. Lungs: clear to ausculation bilaterally. Cardiovascular: regular rate and rhythm.      Upstream - 03/09/24 0849       Pregnancy Intention Screening   Does the patient want to become pregnant in the next year? No    Does the patient's partner want to become pregnant in the next year? No    Would the patient like to discuss contraceptive options today? No      Contraception Wrap Up   Current Method Oral Contraceptive    End Method Oral Contraceptive    Contraception Counseling Provided No          Assessment:     1. Encounter for surveillance of contraceptive pills (Primary) Will continue Loestrin  24 Fe Meds ordered this encounter  Medications   Norethindrone  Acetate-Ethinyl Estrad-FE (LOESTRIN  24 FE) 1-20 MG-MCG(24) tablet    Sig: Take 1 tablet by mouth daily.    Dispense:  84 tablet    Refill:   3    Supervising Provider:   JAYNE VONN DEL [[id number]]    Lot Number?:   [id number]    Expiration Date?:   10/26/2024        Plan:     Follow up in 3 months or sooner if needed

## 2024-03-23 ENCOUNTER — Ambulatory Visit
Admission: RE | Admit: 2024-03-23 | Discharge: 2024-03-23 | Disposition: A | Discharge status: Home / Self Care | Attending: Family Medicine | Admitting: Family Medicine

## 2024-03-23 VITALS — BP 152/87 | HR 93 | Temp 98.8°F | Resp 20

## 2024-03-23 DIAGNOSIS — J039 Acute tonsillitis, unspecified: Secondary | ICD-10-CM | POA: Diagnosis not present

## 2024-03-23 LAB — POCT RAPID STREP A (OFFICE): Rapid Strep A Screen: NEGATIVE

## 2024-03-23 MED ORDER — FLUCONAZOLE 150 MG PO TABS: 150.0000 mg | ORAL_TABLET | Freq: Once | ORAL | 0 refills | Status: AC

## 2024-03-23 MED ORDER — AMOXICILLIN 875 MG PO TABS
875.0000 mg | ORAL_TABLET | Freq: Two times a day (BID) | ORAL | 0 refills | Status: AC
Start: 2024-03-23 — End: ?

## 2024-03-23 NOTE — ED Provider Notes (Signed)
 [facility]    CSN: [id number] Arrival date & time: 03/23/24  0950      History   Chief Complaint Chief Complaint  Patient presents with   Sore Throat    Tonsils swollen again painful to swallow - Entered by patient    HPI Yvette Phelps is a 28 y.o. female.   Patient presenting today with a recurrence of significant tonsillar pain, swelling and pus for the past few days.  States this is the third time this issue has happened over the past few months, resolved typically with amoxicillin  though strep testing has been negative each time.  Denies fever, cough, congestion, chest pain, shortness of breath, abdominal pain, vomiting, diarrhea.  So far trying over-the-counter pain relievers with minimal relief.    History reviewed. No pertinent past medical history.  Patient Active Problem List   Diagnosis Date Noted   Mass of upper inner quadrant of left breast 01/06/2024   Encounter for surveillance of contraceptive pills 12/08/2023   Irregular intermenstrual bleeding 12/08/2023   White coat syndrome without diagnosis of hypertension 12/08/2023   Chronic migraine without aura, with intractable migraine, so stated, with status migrainosus 09/18/2017   Morbid obesity (HCC) 09/18/2017    Past Surgical History:  Procedure Laterality Date   UMBILICAL HERNIA REPAIR     Done around age 72    OB History     Gravida  0   Para  0   Term  0   Preterm  0   AB  0   Living  0      SAB  0   IAB  0   Ectopic  0   Multiple  0   Live Births  0            Home Medications    Prior to Admission medications   Medication Sig Start Date End Date Taking? Authorizing Provider  amoxicillin  (AMOXIL ) 875 MG tablet Take 1 tablet (875 mg total) by mouth 2 (two) times daily. 03/23/24  Yes Stuart Vernell Norris, PA-C  albuterol  (VENTOLIN  HFA) 108 [phone] Base) MCG/ACT inhaler Inhale 2 puffs into the lungs every 4 (four) hours as needed. 02/18/24   Stuart Vernell Norris, PA-C  azelastine  (ASTELIN ) 0.1 % nasal spray Place 1 spray into both nostrils 2 (two) times daily. Use in each nostril as directed Patient not taking: Reported on 03/09/2024 02/18/24   Stuart Vernell Norris, PA-C  cetirizine  (ZYRTEC ) 10 MG tablet Take 1 tablet (10 mg total) by mouth daily. 01/28/24   Leath-Warren, Etta PARAS, NP  fluticasone  (FLONASE ) 50 MCG/ACT nasal spray Place 2 sprays into both nostrils daily. Patient not taking: Reported on 03/09/2024 01/28/24   Leath-Warren, Etta PARAS, NP  Norethindrone  Acetate-Ethinyl Estrad-FE (LOESTRIN  24 FE) 1-20 MG-MCG(24) tablet Take 1 tablet by mouth daily. 03/09/24   Signa Delon LABOR, NP    Family History Family History  Problem Relation Age of Onset   Hypertension Mother    Hypertension Father    Heart disease Maternal Grandmother    Hypertension Maternal Grandmother    Stroke Maternal Grandmother    Hypertension Maternal Grandfather    Hypertension Paternal Grandmother    Cancer Paternal Grandfather    Diabetes Paternal Grandfather    Kidney disease Paternal Grandfather    Hypertension Paternal Grandfather     Social History Social History   Tobacco Use   Smoking status: Never   Smokeless tobacco: Never  Vaping Use   Vaping status: Every Day  Substance Use Topics   Alcohol use: Yes    Comment: social drinker   Drug use: No     Allergies   Patient has no known allergies.   Review of Systems Review of Systems Per HPI  Physical Exam Triage Vital Signs ED Triage Vitals  Encounter Vitals Group     BP 03/23/24 0955 (!) 152/87     Girls Systolic BP Percentile --      Girls Diastolic BP Percentile --      Boys Systolic BP Percentile --      Boys Diastolic BP Percentile --      Pulse Rate 03/23/24 0955 93     Resp 03/23/24 0955 20     Temp 03/23/24 0955 98.8 F (37.1 C)     Temp Source 03/23/24 0955 Oral     SpO2 03/23/24 0955 95 %     Weight --      Height --      Head Circumference --      Peak  Flow --      Pain Score 03/23/24 0957 7     Pain Loc --      Pain Education --      Exclude from Growth Chart --    No data found.  Updated Vital Signs BP (!) 152/87 (BP Location: Right Wrist)   Pulse 93   Temp 98.8 F (37.1 C) (Oral)   Resp 20   LMP 02/28/2024   SpO2 95%   Visual Acuity Right Eye Distance:   Left Eye Distance:   Bilateral Distance:    Right Eye Near:   Left Eye Near:    Bilateral Near:     Physical Exam Vitals and nursing note reviewed.  Constitutional:      Appearance: Normal appearance.  HENT:     Head: Atraumatic.     Right Ear: Tympanic membrane and external ear normal.     Left Ear: Tympanic membrane and external ear normal.     Nose: Nose normal.     Mouth/Throat:     Mouth: Mucous membranes are moist.     Pharynx: Oropharyngeal exudate and posterior oropharyngeal erythema present.     Comments: Significant bilateral tonsillar erythema, edema, copious exudates.  Uvula midline, oral airway patent Eyes:     Extraocular Movements: Extraocular movements intact.     Conjunctiva/sclera: Conjunctivae normal.  Cardiovascular:     Rate and Rhythm: Normal rate.  Pulmonary:     Effort: Pulmonary effort is normal.  Musculoskeletal:        General: Normal range of motion.     Cervical back: Normal range of motion and neck supple.  Lymphadenopathy:     Cervical: No cervical adenopathy.  Skin:    General: Skin is warm and dry.  Neurological:     Mental Status: She is alert and oriented to person, place, and time.  Psychiatric:        Mood and Affect: Mood normal.        Thought Content: Thought content normal.      UC Treatments / Results  Labs (all labs ordered are listed, but only abnormal results are displayed) Labs Reviewed  POCT RAPID STREP A (OFFICE)    EKG   Radiology No results found.  Procedures Procedures (including critical care time)  Medications Ordered in UC Medications - No data to display  Initial Impression /  Assessment and Plan / UC Course  I have reviewed the triage vital signs and  the nursing notes.  Pertinent labs & imaging results that were available during my care of the patient were reviewed by me and considered in my medical decision making (see chart for details).     Rapid strep again negative, overall vitals and exam reassuring but given appearance of tonsils will cover with Amoxil  while awaiting ENT follow-up given recurrence of symptoms.  Supportive over-the-counter medications, home care and return precautions reviewed.  Work note given.  Final Clinical Impressions(s) / UC Diagnoses   Final diagnoses:  Acute tonsillitis, unspecified etiology     Discharge Instructions      You may go to the [facility] health website and click on the find a provider tab to find providers taking new patients.  You can even schedule your new patient appointment on this tab.  Call the ear nose and throat specialist to see if they can direct schedule you additionally as this has been a recurrent issue for you.  Take the full course of antibiotics, change her toothbrush after about 2 days on the medication.    ED Prescriptions     Medication Sig Dispense Auth. Provider   amoxicillin  (AMOXIL ) 875 MG tablet Take 1 tablet (875 mg total) by mouth 2 (two) times daily. 20 tablet Stuart Vernell Norris, [place]      PDMP not reviewed this encounter.   Stuart Vernell Norris, [place] 03/23/24 1040

## 2024-03-23 NOTE — ED Triage Notes (Signed)
 Sore throat that has been going on off and on since October.  States hurts to swallow  and right ear pain.

## 2024-03-23 NOTE — Discharge Instructions (Signed)
 You may go to the [facility] health website and click on the find a provider tab to find providers taking new patients.  You can even schedule your new patient appointment on this tab.  Call the ear nose and throat specialist to see if they can direct schedule you additionally as this has been a recurrent issue for you.  Take the full course of antibiotics, change her toothbrush after about 2 days on the medication.

## 2024-04-05 ENCOUNTER — Ambulatory Visit
Admission: RE | Admit: 2024-04-05 | Discharge: 2024-04-05 | Disposition: A | Discharge status: Home / Self Care | Attending: Family Medicine

## 2024-04-05 VITALS — BP 152/89 | HR 89 | Temp 98.2°F | Resp 20

## 2024-04-05 DIAGNOSIS — J0391 Acute recurrent tonsillitis, unspecified: Secondary | ICD-10-CM | POA: Diagnosis not present

## 2024-04-05 MED ORDER — CLINDAMYCIN HCL 300 MG PO CAPS: 300.0000 mg | ORAL_CAPSULE | Freq: Two times a day (BID) | ORAL | 0 refills | Status: AC

## 2024-04-05 MED ORDER — CHLORHEXIDINE GLUCONATE 0.12 % MT SOLN: 15.0000 mL | Freq: Two times a day (BID) | OROMUCOSAL | 0 refills | Status: AC

## 2024-04-05 NOTE — ED Triage Notes (Signed)
 Pt reports throat pain and discomfort states her throat is swollen and is having difficulty swallowing pt states she just completed amoxil  on Thursday and then on Friday she noticed the swelling in her tonsils. Has had a consultation with ENT but needs sleep study prior to the tonsil consultation. Sleep study is in the works.

## 2024-04-05 NOTE — Discharge Instructions (Signed)
 Trial of the Peridex  oral rinse, if not improving you may start the clindamycin  and follow-up with ENT.

## 2024-04-05 NOTE — ED Provider Notes (Signed)
 [facility]    CSN: [id number] Arrival date & time: 04/05/24  1738      History   Chief Complaint Chief Complaint  Patient presents with   Sore Throat    Tonsils swollen having trouble swallowing food - Entered by patient    HPI Yvette Phelps is a 28 y.o. female.   Presenting today with acute on chronic severe sore throat, swollen tonsils, difficulty swallowing due to pain.  Has had about 4 episodes now of the same, always testing negative for strep but responding to antibiotics.  Just completed a course of Augmentin about 3 days ago per patient and did note improvement while on the medication.  Symptoms started about a day ago and are progressively worsening.  She denies fever, chills, abdominal pain, nausea, vomiting, rashes.  Recently had an initial consult with ENT regarding this issue, awaiting a sleep study prior to discussing tonsillectomy.    History reviewed. No pertinent past medical history.  Patient Active Problem List   Diagnosis Date Noted   Mass of upper inner quadrant of left breast 01/06/2024   Encounter for surveillance of contraceptive pills 12/08/2023   Irregular intermenstrual bleeding 12/08/2023   White coat syndrome without diagnosis of hypertension 12/08/2023   Chronic migraine without aura, with intractable migraine, so stated, with status migrainosus 09/18/2017   Morbid obesity (HCC) 09/18/2017    Past Surgical History:  Procedure Laterality Date   UMBILICAL HERNIA REPAIR     Done around age 62    OB History     Gravida  0   Para  0   Term  0   Preterm  0   AB  0   Living  0      SAB  0   IAB  0   Ectopic  0   Multiple  0   Live Births  0            Home Medications    Prior to Admission medications   Medication Sig Start Date End Date Taking? Authorizing Provider  chlorhexidine  (PERIDEX ) 0.12 % solution Use as directed 15 mLs in the mouth or throat 2 (two) times daily. 04/05/24  Yes Stuart Vernell Norris, PA-C  clindamycin  (CLEOCIN ) 300 MG capsule Take 1 capsule (300 mg total) by mouth 2 (two) times daily. 04/05/24  Yes Stuart Vernell Norris, PA-C  albuterol  (VENTOLIN  HFA) 108 (90 Base) MCG/ACT inhaler Inhale 2 puffs into the lungs every 4 (four) hours as needed. 02/18/24   Stuart Vernell Norris, PA-C  amoxicillin  (AMOXIL ) 875 MG tablet Take 1 tablet (875 mg total) by mouth 2 (two) times daily. 03/23/24   Stuart Vernell Norris, PA-C  azelastine  (ASTELIN ) 0.1 % nasal spray Place 1 spray into both nostrils 2 (two) times daily. Use in each nostril as directed Patient not taking: Reported on 03/09/2024 02/18/24   Stuart Vernell Norris, PA-C  cetirizine  (ZYRTEC ) 10 MG tablet Take 1 tablet (10 mg total) by mouth daily. 01/28/24   Leath-Warren, Etta PARAS, NP  fluticasone  (FLONASE ) 50 MCG/ACT nasal spray Place 2 sprays into both nostrils daily. Patient not taking: Reported on 03/09/2024 01/28/24   Leath-Warren, Etta PARAS, NP  Norethindrone  Acetate-Ethinyl Estrad-FE (LOESTRIN  24 FE) 1-20 MG-MCG(24) tablet Take 1 tablet by mouth daily. 03/09/24   Signa Delon LABOR, NP    Family History Family History  Problem Relation Age of Onset   Hypertension Mother    Hypertension Father    Heart disease Maternal Grandmother  Hypertension Maternal Grandmother    Stroke Maternal Grandmother    Hypertension Maternal Grandfather    Hypertension Paternal Grandmother    Cancer Paternal Grandfather    Diabetes Paternal Grandfather    Kidney disease Paternal Grandfather    Hypertension Paternal Grandfather     Social History Social History   Tobacco Use   Smoking status: Never   Smokeless tobacco: Never  Vaping Use   Vaping status: Every Day  Substance Use Topics   Alcohol use: Yes    Comment: social drinker   Drug use: No     Allergies   Patient has no known allergies.   Review of Systems Review of Systems PER HPI  Physical Exam Triage Vital Signs ED Triage Vitals  Encounter  Vitals Group     BP 04/05/24 1758 (!) 152/89     Girls Systolic BP Percentile --      Girls Diastolic BP Percentile --      Boys Systolic BP Percentile --      Boys Diastolic BP Percentile --      Pulse Rate 04/05/24 1758 89     Resp 04/05/24 1758 20     Temp 04/05/24 1758 98.2 F (36.8 C)     Temp Source 04/05/24 1758 Oral     SpO2 04/05/24 1758 97 %     Weight --      Height --      Head Circumference --      Peak Flow --      Pain Score 04/05/24 1757 4     Pain Loc --      Pain Education --      Exclude from Growth Chart --    No data found.  Updated Vital Signs BP (!) 152/89 (BP Location: Right Arm)   Pulse 89   Temp 98.2 F (36.8 C) (Oral)   Resp 20   LMP 03/29/2024 (Exact Date)   SpO2 97%   Visual Acuity Right Eye Distance:   Left Eye Distance:   Bilateral Distance:    Right Eye Near:   Left Eye Near:    Bilateral Near:     Physical Exam Vitals and nursing note reviewed.  Constitutional:      Appearance: Normal appearance. She is not ill-appearing.  HENT:     Head: Atraumatic.     Nose: Nose normal.     Mouth/Throat:     Mouth: Mucous membranes are moist.     Pharynx: Oropharyngeal exudate and posterior oropharyngeal erythema present.  Eyes:     Extraocular Movements: Extraocular movements intact.     Conjunctiva/sclera: Conjunctivae normal.  Cardiovascular:     Rate and Rhythm: Normal rate.  Pulmonary:     Effort: Pulmonary effort is normal.  Musculoskeletal:        General: Normal range of motion.     Cervical back: Normal range of motion and neck supple.  Skin:    General: Skin is warm and dry.  Neurological:     Mental Status: She is alert and oriented to person, place, and time.  Psychiatric:        Mood and Affect: Mood normal.        Thought Content: Thought content normal.        Judgment: Judgment normal.     UC Treatments / Results  Labs (all labs ordered are listed, but only abnormal results are displayed) Labs Reviewed - No  data to display  EKG   Radiology No  results found.  Procedures Procedures (including critical care time)  Medications Ordered in UC Medications - No data to display  Initial Impression / Assessment and Plan / UC Course  I have reviewed the triage vital signs and the nursing notes.  Pertinent labs & imaging results that were available during my care of the patient were reviewed by me and considered in my medical decision making (see chart for details).     Hypertensive in triage, otherwise vital signs reassuring.  Acute on chronic issue, now following with ENT looking toward a tonsillectomy.  Rapid strep deferred at this time as it has been negative the last 3 times this has recurred and she has just finished antibiotics several days ago.  Patient wanting to avoid further antibiotics at this point if possible though that is the only thing previously it has responded to.  Trial Peridex  rinse to see if this improves symptoms in any way, supportive over-the-counter medications and home care.  Clindamycin  sent in case not improving.  Final Clinical Impressions(s) / UC Diagnoses   Final diagnoses:  Recurrent tonsillitis     Discharge Instructions      Trial of the Peridex  oral rinse, if not improving you may start the clindamycin  and follow-up with ENT.    ED Prescriptions     Medication Sig Dispense Auth. Provider   clindamycin  (CLEOCIN ) 300 MG capsule Take 1 capsule (300 mg total) by mouth 2 (two) times daily. 14 capsule Stuart Vernell Norris, PA-C   chlorhexidine  (PERIDEX ) 0.12 % solution Use as directed 15 mLs in the mouth or throat 2 (two) times daily. 120 mL Stuart Vernell Norris, [place]      PDMP not reviewed this encounter.   Stuart Vernell Norris, [place] 04/05/24 1830

## 2024-06-08 ENCOUNTER — Ambulatory Visit: Admitting: Adult Health

## 2024-06-09 ENCOUNTER — Ambulatory Visit: Admitting: Adult Health
